# Patient Record
Sex: Female | Born: 1970
Health system: Southern US, Community
[De-identification: ages and names within clinical notes are randomized; demographics above are authoritative.]

---

## 2007-09-03 ENCOUNTER — Ambulatory Visit: Payer: Self-pay | Admitting: Family Medicine

## 2007-09-20 ENCOUNTER — Ambulatory Visit: Payer: Self-pay | Admitting: Surgery

## 2007-09-23 ENCOUNTER — Emergency Department: Payer: Self-pay | Admitting: Emergency Medicine

## 2007-09-23 HISTORY — PX: BREAST BIOPSY: SHX20

## 2007-12-12 ENCOUNTER — Ambulatory Visit: Payer: Self-pay | Admitting: Specialist

## 2007-12-19 ENCOUNTER — Ambulatory Visit: Payer: Self-pay | Admitting: Specialist

## 2008-03-07 HISTORY — PX: CARPAL TUNNEL RELEASE: SHX101

## 2009-09-09 ENCOUNTER — Ambulatory Visit: Payer: Self-pay | Admitting: Family Medicine

## 2009-09-15 ENCOUNTER — Ambulatory Visit: Payer: Self-pay | Admitting: Family Medicine

## 2012-11-28 LAB — HM PAP SMEAR: HM Pap smear: NEGATIVE

## 2012-11-28 LAB — FECAL OCCULT BLOOD, GUAIAC: Fecal Occult Blood: NEGATIVE

## 2012-12-10 ENCOUNTER — Ambulatory Visit: Payer: Self-pay | Admitting: Family Medicine

## 2012-12-10 LAB — HM MAMMOGRAPHY

## 2014-02-07 LAB — BASIC METABOLIC PANEL
BUN: 16 mg/dL (ref 4–21)
Creatinine: 0.8 mg/dL (ref <=1.1)
Glucose: 104 mg/dL
Potassium: 4.3 mmol/L (ref 3.4–5.3)
Sodium: 139 mmol/L (ref 137–147)

## 2014-02-07 LAB — LIPID PANEL
Cholesterol: 196 mg/dL (ref 0–200)
HDL: 53 mg/dL (ref 35–70)
LDL Cholesterol: 124 mg/dL
Triglycerides: 95 mg/dL (ref 40–160)

## 2014-02-07 LAB — CBC AND DIFFERENTIAL
HCT: 41 % (ref 36–46)
Hemoglobin: 14.1 g/dL (ref 12.0–16.0)
Platelets: 234 10*3/uL (ref 150–399)
WBC: 5.4 10^3/mL

## 2014-02-07 LAB — HEPATIC FUNCTION PANEL
ALT: 25 U/L (ref 7–35)
AST: 14 U/L (ref 13–35)

## 2014-02-07 LAB — HEMOGLOBIN A1C: Hgb A1c MFr Bld: 5.8 % (ref 4.0–6.0)

## 2014-02-07 LAB — TSH: TSH: 1.84 u[IU]/mL (ref <=5.90)

## 2014-06-26 DIAGNOSIS — R202 Paresthesia of skin: Secondary | ICD-10-CM | POA: Insufficient documentation

## 2014-06-26 DIAGNOSIS — G609 Hereditary and idiopathic neuropathy, unspecified: Secondary | ICD-10-CM | POA: Insufficient documentation

## 2014-06-26 DIAGNOSIS — R7303 Prediabetes: Secondary | ICD-10-CM | POA: Insufficient documentation

## 2014-06-26 DIAGNOSIS — Z6841 Body Mass Index (BMI) 40.0 and over, adult: Secondary | ICD-10-CM | POA: Insufficient documentation

## 2014-07-01 ENCOUNTER — Other Ambulatory Visit: Payer: Self-pay | Admitting: Family Medicine

## 2014-07-01 DIAGNOSIS — Z1231 Encounter for screening mammogram for malignant neoplasm of breast: Secondary | ICD-10-CM

## 2014-07-09 ENCOUNTER — Ambulatory Visit
Admission: RE | Admit: 2014-07-09 | Discharge: 2014-07-09 | Disposition: A | Discharge status: Home / Self Care | Payer: BLUE CROSS/BLUE SHIELD | Source: Ambulatory Visit | Attending: Family Medicine | Admitting: Family Medicine

## 2014-07-09 DIAGNOSIS — Z1231 Encounter for screening mammogram for malignant neoplasm of breast: Secondary | ICD-10-CM | POA: Diagnosis not present

## 2014-08-13 ENCOUNTER — Ambulatory Visit (INDEPENDENT_AMBULATORY_CARE_PROVIDER_SITE_OTHER): Payer: BLUE CROSS/BLUE SHIELD | Admitting: Family Medicine

## 2014-08-13 ENCOUNTER — Encounter: Payer: Self-pay | Admitting: Family Medicine

## 2014-08-13 VITALS — BP 126/78 | HR 96 | Temp 98.1°F | Resp 16 | Ht 62.0 in | Wt 247.0 lb

## 2014-08-13 DIAGNOSIS — R7309 Other abnormal glucose: Secondary | ICD-10-CM

## 2014-08-13 DIAGNOSIS — E559 Vitamin D deficiency, unspecified: Secondary | ICD-10-CM | POA: Diagnosis not present

## 2014-08-13 DIAGNOSIS — R7303 Prediabetes: Secondary | ICD-10-CM

## 2014-08-13 LAB — POCT GLYCOSYLATED HEMOGLOBIN (HGB A1C): Hemoglobin A1C: 5.5

## 2014-08-13 NOTE — Progress Notes (Signed)
Subjective:     Patient ID: Heather Hester, female   DOB: [birth year], 44 y.o.   MRN: [id number]  Hyperglycemia This is a chronic problem. The problem occurs constantly. Associated symptoms include arthralgias (Occasionally). Pertinent negatives include no chest pain, chills, coughing, diaphoresis, fatigue, fever, headaches, joint swelling, myalgias, nausea, neck pain, numbness or weakness.  Pt's last A1C was on 02/07/2014 and the result was 5.8%.  She does not check her sugars.  She also reports not having any hypoglycemic episodes.    Vitamin D  Pt last Vitamin D check was 16.1 on 02/07/2014, she advised to start Vitamin D 2,000 units a day.  She reports that she has not been taking it regularly.       Review of Systems  Constitutional: Negative for fever, chills, diaphoresis, activity change, appetite change, fatigue and unexpected weight change.  Respiratory: Negative for apnea, cough, choking, chest tightness, shortness of breath, wheezing and stridor.   Cardiovascular: Positive for leg swelling (Ankles swell occasionally). Negative for chest pain and palpitations.  Gastrointestinal: Negative.  Negative for nausea.  Endocrine: Negative for cold intolerance, heat intolerance, polydipsia, polyphagia and polyuria.  Musculoskeletal: Positive for arthralgias (Occasionally). Negative for myalgias, back pain, joint swelling, gait problem, neck pain and neck stiffness.  Neurological: Negative for dizziness, tremors, weakness, light-headedness, numbness and headaches.    Patient Active Problem List   Diagnosis Date Noted  . Vitamin D deficiency 08/13/2014  . Body mass index of 60 or higher 06/26/2014  . Idiopathic peripheral neuropathy 06/26/2014  . Burning or prickling sensation 06/26/2014  . Borderline diabetes 06/26/2014   Family History  Problem Relation Age of Onset  . COPD Mother   . Depression Mother   . Kidney disease Father   . Hypertension Brother   . Sleep apnea  Brother    History   Social History  . Marital Status: Single    Spouse Name: N/A  . Number of Children: 0  . Years of Education: HS   Occupational History  . [employer]     Full-Time   Social History Main Topics  . Smoking status: Never Smoker   . Smokeless tobacco: Never Used  . Alcohol Use: No  . Drug Use: No  . Sexual Activity: Not on file   Other Topics Concern  . Not on file   Social History Narrative   Past Surgical History  Procedure Laterality Date  . Carpal tunnel release Left 2010  . Breast biopsy Right 09/23/2007    core bx with clip   No Known Allergies Current Outpatient Prescriptions on File Prior to Visit  Medication Sig Dispense Refill  . Calcium-Magnesium-Vitamin D (CALCIUM 500 PO) Take 2 tablets by mouth daily.    Marland Kitchen. MILK THISTLE PO Take 3 tablets by mouth daily.    . MULTIPLE VITAMIN PO Take 1 tablet by mouth daily.     No current facility-administered medications on file prior to visit.   BP 126/78 mmHg  Pulse 96  Temp(Src) 98.1 F (36.7 C) (Oral)  Resp 16  Ht 5\' 2"  (1.575 m)  Wt 247 lb (112.038 kg)  BMI 45.17 kg/m2  LMP 07/21/2014 (Approximate)      Objective:   Physical Exam  Constitutional: She is oriented to person, place, and time. She appears well-developed and well-nourished.  Neurological: She is alert and oriented to person, place, and time.       Assessment:     See below.  Plan:     1. Borderline diabetes Improved. Continue to advance diet.   - POCT HgB A1C  2. Vitamin D deficiency Restart Vit D at 2000 iu daily.      Goals    . diet     Going to due formal diet.     . Exercise 150 minutes per week (moderate activity)

## 2014-08-14 MED ORDER — VITAMIN D 50 MCG (2000 UT) PO CAPS: 1.0000 | ORAL_CAPSULE | Freq: Every day | ORAL | Status: DC

## 2015-02-25 ENCOUNTER — Emergency Department: Payer: BLUE CROSS/BLUE SHIELD

## 2015-02-25 ENCOUNTER — Emergency Department
Admission: EM | Admit: 2015-02-25 | Discharge: 2015-02-25 | Disposition: A | Discharge status: Home / Self Care | Payer: BLUE CROSS/BLUE SHIELD | Attending: Emergency Medicine | Admitting: Emergency Medicine

## 2015-02-25 DIAGNOSIS — R079 Chest pain, unspecified: Secondary | ICD-10-CM | POA: Diagnosis present

## 2015-02-25 DIAGNOSIS — M546 Pain in thoracic spine: Secondary | ICD-10-CM | POA: Insufficient documentation

## 2015-02-25 DIAGNOSIS — M542 Cervicalgia: Secondary | ICD-10-CM | POA: Insufficient documentation

## 2015-02-25 LAB — COMPREHENSIVE METABOLIC PANEL
ALT: 20 U/L (ref 14–54)
AST: 17 U/L (ref 15–41)
Albumin: 4.1 g/dL (ref 3.5–5.0)
Alkaline Phosphatase: 67 U/L (ref 38–126)
Anion gap: 6 (ref 5–15)
BUN: 13 mg/dL (ref 6–20)
CO2: 26 mmol/L (ref 22–32)
Calcium: 8.9 mg/dL (ref 8.9–10.3)
Chloride: 107 mmol/L (ref 101–111)
Creatinine, Ser: 0.79 mg/dL (ref 0.44–1.00)
GFR calc Af Amer: >60 mL/min (ref >60)
GFR calc non Af Amer: >60 mL/min (ref >60)
Glucose, Bld: 99 mg/dL (ref 65–99)
Potassium: 3.9 mmol/L (ref 3.5–5.1)
Sodium: 139 mmol/L (ref 135–145)
Total Bilirubin: 0.5 mg/dL (ref 0.3–1.2)
Total Protein: 7.4 g/dL (ref 6.5–8.1)

## 2015-02-25 LAB — CBC
HCT: 42.3 % (ref 35.0–47.0)
Hemoglobin: 13.5 g/dL (ref 12.0–16.0)
MCH: 27.2 pg (ref 26.0–34.0)
MCHC: 32 g/dL (ref 32.0–36.0)
MCV: 84.9 fL (ref 80.0–100.0)
Platelets: 211 10*3/uL (ref 150–440)
RBC: 4.98 MIL/uL (ref 3.80–5.20)
RDW: 14.5 % (ref 11.5–14.5)
WBC: 6.6 10*3/uL (ref 3.6–11.0)

## 2015-02-25 LAB — TROPONIN I: Troponin I: <0.03 ng/mL (ref <0.031)

## 2015-02-25 MED ORDER — RANITIDINE HCL 150 MG PO TABS: 150.0000 mg | ORAL_TABLET | Freq: Every day | ORAL | Status: DC

## 2015-02-25 MED ORDER — GI COCKTAIL ~~LOC~~
30.0000 mL | Freq: Once | ORAL | Status: AC
  Administered 2015-02-25: 30 mL via ORAL
  Filled 2015-02-25: qty 30

## 2015-02-25 NOTE — Discharge Instructions (Signed)
Nonspecific Chest Pain  °Chest pain can be caused by many different conditions. There is always a chance that your pain could be related to something serious, such as a heart attack or a blood clot in your lungs. Chest pain can also be caused by conditions that are not life-threatening. If you have chest pain, it is very important to follow up with your health care provider. °CAUSES  °Chest pain can be caused by: °· Heartburn. °· Pneumonia or bronchitis. °· Anxiety or stress. °· Inflammation around your heart (pericarditis) or lung (pleuritis or pleurisy). °· A blood clot in your lung. °· A collapsed lung (pneumothorax). It can develop suddenly on its own (spontaneous pneumothorax) or from trauma to the chest. °· Shingles infection (varicella-zoster virus). °· Heart attack. °· Damage to the bones, muscles, and cartilage that make up your chest wall. This can include: °· Bruised bones due to injury. °· Strained muscles or cartilage due to frequent or repeated coughing or overwork. °· Fracture to one or more ribs. °· Sore cartilage due to inflammation (costochondritis). °RISK FACTORS  °Risk factors for chest pain may include: °· Activities that increase your risk for trauma or injury to your chest. °· Respiratory infections or conditions that cause frequent coughing. °· Medical conditions or overeating that can cause heartburn. °· Heart disease or family history of heart disease. °· Conditions or health behaviors that increase your risk of developing a blood clot. °· Having had chicken pox (varicella zoster). °SIGNS AND SYMPTOMS °Chest pain can feel like: °· Burning or tingling on the surface of your chest or deep in your chest. °· Crushing, pressure, aching, or squeezing pain. °· Dull or sharp pain that is worse when you move, cough, or take a deep breath. °· Pain that is also felt in your back, neck, shoulder, or arm, or pain that spreads to any of these areas. °Your chest pain may come and go, or it may stay  constant. °DIAGNOSIS °Lab tests or other studies may be needed to find the cause of your pain. Your health care provider may have you take a test called an ambulatory ECG (electrocardiogram). An ECG records your heartbeat patterns at the time the test is performed. You may also have other tests, such as: °· Transthoracic echocardiogram (TTE). During echocardiography, sound waves are used to create a picture of all of the heart structures and to look at how blood flows through your heart. °· Transesophageal echocardiogram (TEE). This is a more advanced imaging test that obtains images from inside your body. It allows your health care provider to see your heart in finer detail. °· Cardiac monitoring. This allows your health care provider to monitor your heart rate and rhythm in real time. °· Holter monitor. This is a portable device that records your heartbeat and can help to diagnose abnormal heartbeats. It allows your health care provider to track your heart activity for several days, if needed. °· Stress tests. These can be done through exercise or by taking medicine that makes your heart beat more quickly. °· Blood tests. °· Imaging tests. °TREATMENT  °Your treatment depends on what is causing your chest pain. Treatment may include: °· Medicines. These may include: °· Acid blockers for heartburn. °· Anti-inflammatory medicine. °· Pain medicine for inflammatory conditions. °· Antibiotic medicine, if an infection is present. °· Medicines to dissolve blood clots. °· Medicines to treat coronary artery disease. °· Supportive care for conditions that do not require medicines. This may include: °· Resting. °· Applying heat   or cold packs to injured areas. °· Limiting activities until pain decreases. °HOME CARE INSTRUCTIONS °· If you were prescribed an antibiotic medicine, finish it all even if you start to feel better. °· Avoid any activities that bring on chest pain. °· Do not use any tobacco products, including  cigarettes, chewing tobacco, or electronic cigarettes. If you need help quitting, ask your health care provider. °· Do not drink alcohol. °· Take medicines only as directed by your health care provider. °· Keep all follow-up visits as directed by your health care provider. This is important. This includes any further testing if your chest pain does not go away. °· If heartburn is the cause for your chest pain, you may be told to keep your head raised (elevated) while sleeping. This reduces the chance that acid will go from your stomach into your esophagus. °· Make lifestyle changes as directed by your health care provider. These may include: °¨ Getting regular exercise. Ask your health care provider to suggest some activities that are safe for you. °¨ Eating a heart-healthy diet. A registered dietitian can help you to learn healthy eating options. °¨ Maintaining a healthy weight. °¨ Managing diabetes, if necessary. °¨ Reducing stress. °SEEK MEDICAL CARE IF: °· Your chest pain does not go away after treatment. °· You have a rash with blisters on your chest. °· You have a fever. °SEEK IMMEDIATE MEDICAL CARE IF:  °· Your chest pain is worse. °· You have an increasing cough, or you cough up blood. °· You have severe abdominal pain. °· You have severe weakness. °· You faint. °· You have chills. °· You have sudden, unexplained chest discomfort. °· You have sudden, unexplained discomfort in your arms, back, neck, or jaw. °· You have shortness of breath at any time. °· You suddenly start to sweat, or your skin gets clammy. °· You feel nauseous or you vomit. °· You suddenly feel light-headed or dizzy. °· Your heart begins to beat quickly, or it feels like it is skipping beats. °These symptoms may represent a serious problem that is an emergency. Do not wait to see if the symptoms will go away. Get medical help right away. Call your local emergency services (911 in the [place] Do not drive yourself to the hospital. °  °This  information is not intended to replace advice given to you by your health care provider. Make sure you discuss any questions you have with your health care provider. °  °Document Released: 12/01/2004 Document Revised: 03/14/2014 Document Reviewed: 09/27/2013 °Elsevier Interactive Patient Education ©2016 Elsevier [employer] ° °Gastroesophageal Reflux Disease, Adult °Normally, food travels down the esophagus and stays in the stomach to be digested. However, when a person has gastroesophageal reflux disease (GERD), food and stomach acid move back up into the esophagus. When this happens, the esophagus becomes sore and inflamed. Over time, GERD can create small holes (ulcers) in the lining of the esophagus.  °CAUSES °This condition is caused by a problem with the muscle between the esophagus and the stomach (lower esophageal sphincter, or LES). Normally, the LES muscle closes after food passes through the esophagus to the stomach. When the LES is weakened or abnormal, it does not close properly, and that allows food and stomach acid to go back up into the esophagus. The LES can be weakened by certain dietary substances, medicines, and medical conditions, including: °· Tobacco use. °· Pregnancy. °· Having a hiatal hernia. °· Heavy alcohol use. °· Certain foods and beverages, such as coffee,   chocolate, onions, and peppermint. °RISK FACTORS °This condition is more likely to develop in: °· People who have an increased body weight. °· People who have connective tissue disorders. °· People who use NSAID medicines. °SYMPTOMS °Symptoms of this condition include: °· Heartburn. °· Difficult or painful swallowing. °· The feeling of having a lump in the throat. °· A bitter taste in the mouth. °· Bad breath. °· Having a large amount of saliva. °· Having an upset or bloated stomach. °· Belching. °· Chest pain. °· Shortness of breath or wheezing. °· Ongoing (chronic) cough or a night-time cough. °· Wearing away of tooth enamel. °· Weight  loss. °Different conditions can cause chest pain. Make sure to see your health care provider if you experience chest pain. °DIAGNOSIS °Your health care provider will take a medical history and perform a physical exam. To determine if you have mild or severe GERD, your health care provider may also monitor how you respond to treatment. You may also have other tests, including: °· An endoscopy to examine your stomach and esophagus with a small camera. °· A test that measures the acidity level in your esophagus. °· A test that measures how much pressure is on your esophagus. °· A barium swallow or modified barium swallow to show the shape, size, and functioning of your esophagus. °TREATMENT °The goal of treatment is to help relieve your symptoms and to prevent complications. Treatment for this condition may vary depending on how severe your symptoms are. Your health care provider may recommend: °· Changes to your diet. °· Medicine. °· Surgery. °HOME CARE INSTRUCTIONS °Diet °· Follow a diet as recommended by your health care provider. This may involve avoiding foods and drinks such as: °¨ Coffee and tea (with or without caffeine). °¨ Drinks that contain alcohol. °¨ Energy drinks and sports drinks. °¨ Carbonated drinks or sodas. °¨ Chocolate and cocoa. °¨ Peppermint and mint flavorings. °¨ Garlic and onions. °¨ Horseradish. °¨ Spicy and acidic foods, including peppers, chili powder, curry powder, vinegar, hot sauces, and barbecue sauce. °¨ Citrus fruit juices and citrus fruits, such as oranges, lemons, and limes. °¨ Tomato-based foods, such as red sauce, chili, salsa, and pizza with red sauce. °¨ Fried and fatty foods, such as donuts, french fries, potato chips, and high-fat dressings. °¨ High-fat meats, such as hot dogs and fatty cuts of red and white meats, such as rib eye steak, sausage, ham, and bacon. °¨ High-fat dairy items, such as whole milk, butter, and cream cheese. °· Eat small, frequent meals instead of large  meals. °· Avoid drinking large amounts of liquid with your meals. °· Avoid eating meals during the 2-3 hours before bedtime. °· Avoid lying down right after you eat. °· Do not exercise right after you eat. ° General Instructions  °· Pay attention to any changes in your symptoms. °· Take over-the-counter and prescription medicines only as told by your health care provider. Do not take aspirin, ibuprofen, or other NSAIDs unless your health care provider told you to do so. °· Do not use any tobacco products, including cigarettes, chewing tobacco, and e-cigarettes. If you need help quitting, ask your health care provider. °· Wear loose-fitting clothing. Do not wear anything tight around your waist that causes pressure on your abdomen. °· Raise (elevate) the head of your bed 6 inches (15cm). °· Try to reduce your stress, such as with yoga or meditation. If you need help reducing stress, ask your health care provider. °· If you are overweight, reduce your weight to an amount that is   healthy for you. Ask your health care provider for guidance about a safe weight loss goal. °· Keep all follow-up visits as told by your health care provider. This is important. °SEEK MEDICAL CARE IF: °· You have new symptoms. °· You have unexplained weight loss. °· You have difficulty swallowing, or it hurts to swallow. °· You have wheezing or a persistent cough. °· Your symptoms do not improve with treatment. °· You have a hoarse voice. °SEEK IMMEDIATE MEDICAL CARE IF: °· You have pain in your arms, neck, jaw, teeth, or back. °· You feel sweaty, dizzy, or light-headed. °· You have chest pain or shortness of breath. °· You vomit and your vomit looks like blood or coffee grounds. °· You faint. °· Your stool is bloody or black. °· You cannot swallow, drink, or eat. °  °This information is not intended to replace advice given to you by your health care provider. Make sure you discuss any questions you have with your health care provider. °    °Document Released: 12/01/2004 Document Revised: 11/12/2014 Document Reviewed: 06/18/2014 °Elsevier Interactive Patient Education ©2016 Elsevier Inc. ° °

## 2015-02-25 NOTE — ED Notes (Signed)
Pt C/O chest pain since yesterday, L.sided that radiates into back with SOB. No cardiac Hx

## 2015-02-25 NOTE — ED Provider Notes (Signed)
[facility] Emergency Department Provider Note     Time seen: ----------------------------------------- 8:49 AM on 02/25/2015 -----------------------------------------    I have reviewed the triage vital signs and the nursing notes.   HISTORY  Chief Complaint No chief complaint on file.    HPI SELISA TENSLEY is a 44 y.o. female who presents ER for combination of left-sided chest pain, right-sided neck pain and right-sided upper back pain. Patient states it feels like gas pain, nothing makes it better or worse. Patient does not have history of this before, does not have any risk factors for coronary artery disease other than being obese. She denies any recent illness, does not take any medication.   No past medical history on file.  Patient Active Problem List   Diagnosis Date Noted  . Vitamin D deficiency 08/13/2014  . Body mass index of 60 or higher (HCC) 06/26/2014  . Idiopathic peripheral neuropathy (HCC) 06/26/2014  . Burning or prickling sensation 06/26/2014  . Borderline diabetes 06/26/2014    Past Surgical History  Procedure Laterality Date  . Carpal tunnel release Left 2010  . Breast biopsy Right 09/23/2007    core bx with clip    Allergies Review of patient's allergies indicates no known allergies.  Social History Social History  Substance Use Topics  . Smoking status: Never Smoker   . Smokeless tobacco: Never Used  . Alcohol Use: No    Review of Systems Constitutional: Negative for fever. Eyes: Negative for visual changes. ENT: Negative for sore throat. Cardiovascular: Positive for chest pain Respiratory: Negative for shortness of breath. Gastrointestinal: Negative for abdominal pain, vomiting and diarrhea. Genitourinary: Negative for dysuria. Musculoskeletal: Positive for back pain Skin: Negative for rash. Neurological: Negative for headaches, focal weakness or numbness.  10-point ROS otherwise  negative.  ____________________________________________   PHYSICAL EXAM:  VITAL SIGNS: ED Triage Vitals  Enc Vitals Group     BP --      Pulse --      Resp --      Temp --      Temp src --      SpO2 --      Weight --      Height --      Head Cir --      Peak Flow --      Pain Score --      Pain Loc --      Pain Edu? --      Excl. in GC? --     Constitutional: Alert and oriented. Well appearing and in no distress. Eyes: Conjunctivae are normal. PERRL. Normal extraocular movements. ENT   Head: Normocephalic and atraumatic.   Nose: No congestion/rhinnorhea.   Mouth/Throat: Mucous membranes are moist.   Neck: No stridor. Cardiovascular: Normal rate, regular rhythm. Normal and symmetric distal pulses are present in all extremities. No murmurs, rubs, or gallops. Respiratory: Normal respiratory effort without tachypnea nor retractions. Breath sounds are clear and equal bilaterally. No wheezes/rales/rhonchi. Gastrointestinal: Soft and nontender. No distention. No abdominal bruits.  Musculoskeletal: Nontender with normal range of motion in all extremities. No joint effusions.  No lower extremity tenderness nor edema. Neurologic:  Normal speech and language. No gross focal neurologic deficits are appreciated. Speech is normal. No gait instability. Skin:  Skin is warm, dry and intact. No rash noted. Psychiatric: Mood and affect are normal. Speech and behavior are normal. Patient exhibits appropriate insight and judgment. ____________________________________________  EKG: Interpreted by me. Normal sinus rhythm with a  rate of 89 bpm, normal PR interval, normal QS with, normal QT interval.  ____________________________________________  ED COURSE:  Pertinent labs & imaging results that were available during my care of the patient were reviewed by me and considered in my medical decision making (see chart for details). Patient is in no acute distress, low risk for ACS,  we'll check cardiac labs and reevaluate. ____________________________________________    LABS (pertinent positives/negatives)  Labs Reviewed  CBC  TROPONIN I  COMPREHENSIVE METABOLIC PANEL    RADIOLOGY  Chest x-ray IMPRESSION: No edema or consolidation. ____________________________________________  FINAL ASSESSMENT AND PLAN  Nonspecific chest pain  Plan: Patient with labs and imaging as dictated above. Symptoms are likely GERD related. She'll be placed on an acids. She was given GI cocktail here. She stable for outpatient follow-up with her doctor. She is low risk for ACS, heart score is low.   Emily FilbertWilliams, Maizie Garno E, MD   Emily FilbertJonathan E Monzerrat Wellen, MD 02/25/15 [phone]

## 2015-03-13 ENCOUNTER — Ambulatory Visit (INDEPENDENT_AMBULATORY_CARE_PROVIDER_SITE_OTHER): Payer: BLUE CROSS/BLUE SHIELD | Admitting: Family Medicine

## 2015-03-13 ENCOUNTER — Encounter: Payer: Self-pay | Admitting: Family Medicine

## 2015-03-13 VITALS — BP 118/80 | HR 92 | Temp 98.2°F | Resp 16 | Ht 62.0 in | Wt 260.0 lb

## 2015-03-13 DIAGNOSIS — J069 Acute upper respiratory infection, unspecified: Secondary | ICD-10-CM

## 2015-03-13 DIAGNOSIS — R7303 Prediabetes: Secondary | ICD-10-CM | POA: Diagnosis not present

## 2015-03-13 DIAGNOSIS — Z6841 Body Mass Index (BMI) 40.0 and over, adult: Secondary | ICD-10-CM

## 2015-03-13 DIAGNOSIS — Z0000 Encounter for general adult medical examination without abnormal findings: Secondary | ICD-10-CM | POA: Diagnosis not present

## 2015-03-13 DIAGNOSIS — Z1211 Encounter for screening for malignant neoplasm of colon: Secondary | ICD-10-CM

## 2015-03-13 LAB — IFOBT (OCCULT BLOOD): IFOBT: POSITIVE

## 2015-03-13 NOTE — Progress Notes (Signed)
Patient ID: Heather Hester, female   DOB: 1970/08/29, 45 y.o.   MRN: [id number]       Patient: Heather Hester, Female    DOB: 1970/08/29, 45 y.o.   MRN: [id number] Visit Date: 03/13/2015  Today's Provider: Lorie PhenixNancy Jamesa Tedrick, MD   Chief Complaint  Patient presents with  . Annual Exam  . URI  . Obesity   Subjective:    Annual physical exam Heather Hester is a 45 y.o. female who presents today for health maintenance and complete physical. She feels fairly well. She reports exercising none. She reports she is sleeping well.  02/06/14 CPE 11/28/12 Pap-neg; HPV-neg 07/09/14 Mammo-BI-RADS 1  Lab Results  Component Value Date   WBC 6.6 02/25/2015   HGB 13.5 02/25/2015   HCT 42.3 02/25/2015   PLT 211 02/25/2015   GLUCOSE 99 02/25/2015   CHOL 196 02/07/2014   TRIG 95 02/07/2014   HDL 53 02/07/2014   LDLCALC 124 02/07/2014   ALT 20 02/25/2015   AST 17 02/25/2015   NA 139 02/25/2015   K 3.9 02/25/2015   CL 107 02/25/2015   CREATININE 0.79 02/25/2015   BUN 13 02/25/2015   CO2 26 02/25/2015   TSH 1.84 02/07/2014   HGBA1C 5.5 08/13/2014    -----------------------------------------------------------------  Upper Respiratory Infection: Patient complains of symptoms of a URI. Symptoms include cough. Onset of symptoms was 3 days ago, gradually worsening since that time. She also c/o post nasal drip, productive cough with  yellow colored sputum and sinus pressure for the past 2 days .  She is drinking moderate amounts of fluids. Evaluation to date: none. Treatment to date: none.  Weight: Patient would like would like to start an OTC medication for weight loss.  Has bought some herbal  Supplements for me to review.    Wt Readings from Last 3 Encounters:  03/13/15 260 lb (117.935 kg)  02/25/15 250 lb (113.399 kg)  08/13/14 247 lb (112.038 kg)     Follow up ER visit  Patient was seen in ER for chest pain on 02/25/2015. She was treated for heart burn. Treatment for this  included GI cocktail at the ER and Zantac to take at home. She reports this condition is Improved. Patient reports that she never got the Zantac filled. Patient reports she has not had symptoms again.  ------------------------------------------------------------------------------------   Review of Systems  Constitutional: Negative.   HENT: Positive for postnasal drip, rhinorrhea and sneezing.   Eyes: Negative.   Respiratory: Positive for cough.   Cardiovascular: Negative.   Gastrointestinal: Negative.   Endocrine: Negative.   Genitourinary: Negative.   Musculoskeletal: Negative.   Skin: Negative.   Allergic/Immunologic: Negative.   Neurological: Negative.   Hematological: Negative.   Psychiatric/Behavioral: Negative.     Social History      She  reports that she has never smoked. She has never used smokeless tobacco. She reports that she does not drink alcohol or use illicit drugs.       Social History   Social History  . Marital Status: Single    Spouse Name: N/A  . Number of Children: 0  . Years of Education: HS   Occupational History  . [employer]     Full-Time   Social History Main Topics  . Smoking status: Never Smoker   . Smokeless tobacco: Never Used  . Alcohol Use: No  . Drug Use: No  . Sexual Activity: Not Asked   Other Topics Concern  .  None   Social History Narrative    History reviewed. No pertinent past medical history.   Patient Active Problem List   Diagnosis Date Noted  . Vitamin D deficiency 08/13/2014  . Body mass index of 60 or higher (HCC) 06/26/2014  . Idiopathic peripheral neuropathy (HCC) 06/26/2014  . Burning or prickling sensation 06/26/2014  . Borderline diabetes 06/26/2014    Past Surgical History  Procedure Laterality Date  . Carpal tunnel release Left 2010  . Breast biopsy Right 09/23/2007    core bx with clip    Family History        Family Status  Relation Status Death Age  . Mother Alive   .  Father Deceased   . Brother Alive         Her family history includes COPD in her mother; Depression in her mother; Hypertension in her brother; Kidney disease in her father; Sleep apnea in her brother.    No Known Allergies  Previous Medications   CHOLECALCIFEROL (VITAMIN D) 2000 UNITS CAPS    Take 1 capsule (2,000 Units total) by mouth daily.    Patient Care Team: Lorie Phenix, MD as PCP - General (Family Medicine)     Objective:   Vitals: BP 118/80 mmHg  Pulse 92  Temp(Src) 98.2 F (36.8 C)  Resp 16  Ht 5\' 2"  (1.575 m)  Wt 260 lb (117.935 kg)  BMI 47.54 kg/m2  SpO2 96%  LMP 02/26/2015 (Approximate)   Physical Exam  Constitutional: She is oriented to person, place, and time. She appears well-developed and well-nourished.  HENT:  Head: Normocephalic and atraumatic.  Right Ear: Tympanic membrane, external ear and ear canal normal.  Left Ear: Tympanic membrane, external ear and ear canal normal.  Nose: Mucosal edema and rhinorrhea present.  Mouth/Throat: Uvula is midline, oropharynx is clear and moist and mucous membranes are normal.  Eyes: Conjunctivae, EOM and lids are normal. Pupils are equal, round, and reactive to light.  Neck: Trachea normal and normal range of motion. Neck supple. Carotid bruit is not present. No thyroid mass and no thyromegaly present.  Cardiovascular: Normal rate, regular rhythm and normal heart sounds.   Pulmonary/Chest: Effort normal and breath sounds normal.  Abdominal: Soft. Normal appearance and bowel sounds are normal. There is no hepatosplenomegaly. There is no tenderness.  Genitourinary: No breast swelling, tenderness or discharge.  Musculoskeletal: Normal range of motion.  Lymphadenopathy:    She has no cervical adenopathy.    She has no axillary adenopathy.  Neurological: She is alert and oriented to person, place, and time. She has normal strength. No cranial nerve deficit.  Skin: Skin is warm, dry and intact.  Psychiatric: She has  a normal mood and affect. Her speech is normal and behavior is normal. Judgment and thought content normal. Cognition and memory are normal.     Depression Screen PHQ 2/9 Scores 03/13/2015  PHQ - 2 Score 0      Assessment & Plan:     Routine Health Maintenance and Physical Exam  Exercise Activities and Dietary recommendations Goals    . diet     Going to due formal diet.     . Exercise 150 minutes per week (moderate activity)    . Exercise 150 minutes per week (moderate activity)       Immunization History  Administered Date(s) Administered  . Tdap 11/28/2012        1. Annual physical exam Stable. Patient advised to continue eating healthy and exercise daily. -  Lipid Panel With LDL/HDL Ratio - TSH - Comprehensive metabolic panel  2. Borderline diabetes - Hemoglobin A1c  3. Colon cancer screening Positive in office today.  Does have some rectal irritation. Will repeat when irritation settles down and return iFOBT to office.  Stressed importance of this.  - IFOBT POC (occult bld, rslt in office) Results for orders placed or performed in visit on 03/13/15  IFOBT POC (occult bld, rslt in office)  Result Value Ref Range   IFOBT Positive      4. Body mass index of 49 (HCC) Stressed importance of eating healthy, whole foods versus processed food and recheck in 3 to 6 months. Will check labs.  Did look into herbal supplements. Did not have any good trials showing weight loss. One showed liver issues. Will check labs today and call for lab recheck in 4 weeks if decides to try supplements but not advisable.  - TSH - Comprehensive metabolic panel  5. Upper respiratory infection New problem. Patient advised to call if symptoms are worsening or not improving.     Patient seen and examined by Dr. Leo Grosser, and note scribed by Liz Beach. Dimas, CMA.  I have reviewed the document for accuracy and completeness and I agree with above. Leo Grosser, MD    Lorie Phenix, MD    --------------------------------------------------------------------

## 2015-04-03 ENCOUNTER — Encounter: Payer: BLUE CROSS/BLUE SHIELD | Admitting: Family Medicine

## 2015-04-04 LAB — COMPREHENSIVE METABOLIC PANEL
ALT: 19 IU/L (ref 0–32)
AST: 12 IU/L (ref 0–40)
Albumin/Globulin Ratio: 1.6 (ref 1.1–2.5)
Albumin: 4.4 g/dL (ref 3.5–5.5)
Alkaline Phosphatase: 75 IU/L (ref 39–117)
BUN/Creatinine Ratio: 18 (ref 9–23)
BUN: 14 mg/dL (ref 6–24)
Bilirubin Total: 0.3 mg/dL (ref 0.0–1.2)
CO2: 22 mmol/L (ref 18–29)
Calcium: 9.4 mg/dL (ref 8.7–10.2)
Chloride: 100 mmol/L (ref 96–106)
Creatinine, Ser: 0.8 mg/dL (ref 0.57–1.00)
GFR calc Af Amer: 103 mL/min/{1.73_m2} (ref >59)
GFR calc non Af Amer: 89 mL/min/{1.73_m2} (ref >59)
Globulin, Total: 2.7 g/dL (ref 1.5–4.5)
Glucose: 108 mg/dL — ABNORMAL HIGH (ref 65–99)
Potassium: 4.1 mmol/L (ref 3.5–5.2)
Sodium: 142 mmol/L (ref 134–144)
Total Protein: 7.1 g/dL (ref 6.0–8.5)

## 2015-04-04 LAB — LIPID PANEL WITH LDL/HDL RATIO
Cholesterol, Total: 202 mg/dL — ABNORMAL HIGH (ref 100–199)
HDL: 53 mg/dL (ref >39)
LDL Calculated: 122 mg/dL — ABNORMAL HIGH (ref 0–99)
LDl/HDL Ratio: 2.3 ratio units (ref 0.0–3.2)
Triglycerides: 136 mg/dL (ref 0–149)
VLDL Cholesterol Cal: 27 mg/dL (ref 5–40)

## 2015-04-04 LAB — TSH: TSH: 1.71 u[IU]/mL (ref 0.450–4.500)

## 2015-04-04 LAB — HEMOGLOBIN A1C
Est. average glucose Bld gHb Est-mCnc: 120 mg/dL
Hgb A1c MFr Bld: 5.8 % — ABNORMAL HIGH (ref 4.8–5.6)

## 2015-04-06 ENCOUNTER — Telehealth: Payer: Self-pay

## 2015-04-06 NOTE — Telephone Encounter (Signed)
Advised pt of lab results. Pt verbally acknowledges understanding. Asusena Sigley Drozdowski, CMA   

## 2015-04-06 NOTE — Telephone Encounter (Signed)
LMTCB. sd  

## 2015-04-06 NOTE — Telephone Encounter (Signed)
-----   Message from Lorie Phenix, MD sent at 04/04/2015  9:26 AM EST ----- Labs stable. Blood sugar and cholesterol at upper limits of normal.  PLease notify patient. Thanks.

## 2015-04-16 ENCOUNTER — Telehealth: Payer: Self-pay

## 2015-04-16 ENCOUNTER — Telehealth (INDEPENDENT_AMBULATORY_CARE_PROVIDER_SITE_OTHER): Payer: BLUE CROSS/BLUE SHIELD

## 2015-04-16 DIAGNOSIS — R195 Other fecal abnormalities: Secondary | ICD-10-CM

## 2015-04-16 LAB — IFOBT (OCCULT BLOOD): IFOBT: NEGATIVE

## 2015-04-16 NOTE — Telephone Encounter (Signed)
Pt dropped of OC Light.  It was negative for blood.   Thanks,   -Vernona Rieger

## 2015-04-16 NOTE — Telephone Encounter (Signed)
-----   Message from Lorie Phenix, MD sent at 04/16/2015  2:36 PM EST ----- Negative. Please notify patient. Thanks

## 2015-04-16 NOTE — Telephone Encounter (Signed)
Great.  Please notify patient. Thanks.

## 2015-04-16 NOTE — Telephone Encounter (Signed)
LMTCB Jasmain Ahlberg Drozdowski, CMA  

## 2015-09-01 ENCOUNTER — Other Ambulatory Visit: Payer: Self-pay | Admitting: Family Medicine

## 2015-09-01 DIAGNOSIS — Z1231 Encounter for screening mammogram for malignant neoplasm of breast: Secondary | ICD-10-CM

## 2015-09-09 ENCOUNTER — Ambulatory Visit (INDEPENDENT_AMBULATORY_CARE_PROVIDER_SITE_OTHER): Payer: BLUE CROSS/BLUE SHIELD | Admitting: Physician Assistant

## 2015-09-09 ENCOUNTER — Encounter: Payer: Self-pay | Admitting: Physician Assistant

## 2015-09-09 VITALS — BP 138/86 | HR 82 | Temp 98.1°F | Resp 16 | Wt 256.2 lb

## 2015-09-09 DIAGNOSIS — R7303 Prediabetes: Secondary | ICD-10-CM

## 2015-09-09 DIAGNOSIS — R253 Fasciculation: Secondary | ICD-10-CM | POA: Diagnosis not present

## 2015-09-09 DIAGNOSIS — E559 Vitamin D deficiency, unspecified: Secondary | ICD-10-CM | POA: Diagnosis not present

## 2015-09-09 DIAGNOSIS — M2550 Pain in unspecified joint: Secondary | ICD-10-CM

## 2015-09-09 NOTE — Patient Instructions (Signed)
Joint Pain Joint pain, which is also called arthralgia, can be caused by many things. Joint pain often goes away when you follow your health care provider's instructions for relieving pain at home. However, joint pain can also be caused by conditions that require further treatment. Common causes of joint pain include:  Bruising in the area of the joint.  Overuse of the joint.  Wear and tear on the joints that occur with aging (osteoarthritis).  Various other forms of arthritis.  A buildup of a crystal form of uric acid in the joint (gout).  Infections of the joint (septic arthritis) or of the bone (osteomyelitis). Your health care provider may recommend medicine to help with the pain. If your joint pain continues, additional tests may be needed to diagnose your condition. HOME CARE INSTRUCTIONS Watch your condition for any changes. Follow these instructions as directed to lessen the pain that you are feeling.  Take medicines only as directed by your health care provider.  Rest the affected area for as long as your health care provider says that you should. If directed to do so, raise the painful joint above the level of your heart while you are sitting or lying down.  Do not do things that cause or worsen pain.  If directed, apply ice to the painful area:  Put ice in a plastic bag.  Place a towel between your skin and the bag.  Leave the ice on for 20 minutes, 2-3 times per day.  Wear an elastic bandage, splint, or sling as directed by your health care provider. Loosen the elastic bandage or splint if your fingers or toes become numb and tingle, or if they turn cold and blue.  Begin exercising or stretching the affected area as directed by your health care provider. Ask your health care provider what types of exercise are safe for you.  Keep all follow-up visits as directed by your health care provider. This is important. SEEK MEDICAL CARE IF:  Your pain increases, and medicine  does not help.  Your joint pain does not improve within 3 days.  You have increased bruising or swelling.  You have a fever.  You lose 10 lb (4.5 kg) or more without trying. SEEK IMMEDIATE MEDICAL CARE IF:  You are not able to move the joint.  Your fingers or toes become numb or they turn cold and blue.   This information is not intended to replace advice given to you by your health care provider. Make sure you discuss any questions you have with your health care provider.   Document Released: 02/21/2005 Document Revised: 03/14/2014 Document Reviewed: 12/03/2013 Elsevier Interactive Patient Education 2016 Elsevier Inc.  Myoclonus Myoclonus is a term that refers to brief, involuntary twitching or jerking of a muscle or a group of muscles. It describes a symptom, and generally, is not a diagnosis of a disease. The myoclonic twitches or jerks are usually caused by sudden muscle contractions. They also can result from brief lapses of contraction. Myoclonic twitches or jerks may occur:  Alone or in sequence.  In a pattern or without pattern.  Infrequently or many times each minute. Often times, myoclonus is one of several symptoms in a wide variety of nervous system disorders such as:  Multiple sclerosis.  Parkinson's disease.  Alzheimer's disease.  Creutzfeldt-Jakob disease. Familiar examples of normal myoclonus include:  Hiccups and jerks.  "Sleep starts" that some people have while drifting off to sleep. Severe cases can severely limit a person's ability to:  Eat.  Talk.  Walk. Myoclonic jerks commonly occur in individuals with epilepsy. The most common types of myoclonus include:  Action.  Cortical reflex.  Essential.  Palatal.  Progressive myoclonus epilepsy.  Reticular reflex.  Sleep.  Stimulus-sensitive. TREATMENT  Treatment for myoclonus consists of medicines that may help reduce symptoms. These drugs (many of which are also used to treat  epilepsy) include:   Barbiturates.  Clonazepam.  Phenytoin.  Primidone.  Sodium valproate. The complex origins of myoclonus may require the use of multiple drugs.   This information is not intended to replace advice given to you by your health care provider. Make sure you discuss any questions you have with your health care provider.   Document Released: 02/11/2002 Document Revised: 05/16/2011 Document Reviewed: 01/24/2013 Elsevier Interactive Patient Education [employer]2016 Elsevier Inc.

## 2015-09-09 NOTE — Progress Notes (Signed)
Patient: Heather Hester Female    DOB: 1970/09/24   45 y.o.   MRN: [id number] Visit Date: 09/09/2015  Today's Provider: Mar Daring, PA-C   Chief Complaint  Patient presents with  . Knee Pain   Subjective:    Knee Pain  The incident occurred more than 1 week ago (a couple of weeks ago). There was no injury mechanism. The pain is present in the left knee and right knee. Pertinent negatives include no inability to bear weight, loss of sensation, muscle weakness, numbness or tingling. Associated symptoms comments: She was having tightness but reports that she is doing better now.. She reports no foreign bodies present. Exacerbated by: It got aggravated by standing. She has tried nothing for the symptoms.   Patient is concern about having twitching on her body. She reports having twitching on her right fingers. This been happening for a few weeks. 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   Currently her mother is stable enough to be able to stay home during the day while she works. She does report some stress and anxiety with this, but it is more from her wanting to make sure she is there for her mother than it is the stress of caring for her mother.   She has been reading up and wants to make sure it is not Parkinson's or MS.      No Known Allergies Current Meds  Medication Sig  . Cholecalciferol (VITAMIN D) 2000 UNITS CAPS Take 1 capsule (2,000 Units total) by mouth daily. (Patient taking differently: Take 1 capsule by mouth at bedtime. )    Review of Systems  Constitutional: Negative for activity change and fatigue.  HENT: Negative.   Respiratory: Negative for cough, chest tightness and shortness of breath.   Cardiovascular: Negative for chest pain, palpitations and  leg swelling.  Musculoskeletal: Positive for myalgias and arthralgias. Negative for gait problem.  Neurological: Negative for dizziness, tingling, weakness, numbness and headaches.       Twitching of face and fingers  Psychiatric/Behavioral: Negative for sleep disturbance and dysphoric mood. The patient is nervous/anxious.     Social History  Substance Use Topics  . Smoking status: Never Smoker   . Smokeless tobacco: Never Used  . Alcohol Use: No   Objective:   BP 138/86 mmHg  Pulse 82  Temp(Src) 98.1 F (36.7 C) (Oral)  Resp 16  Wt 256 lb 3.2 oz (116.212 kg)  LMP 08/23/2015  Physical Exam  Constitutional: She is oriented to person, place, and time. She appears well-developed and well-nourished. No distress.  Neck: Normal range of motion. Neck supple.  Cardiovascular: Normal rate, regular rhythm and normal heart sounds.  Exam reveals no gallop and no friction rub.   No murmur heard. Pulmonary/Chest: Effort normal and breath sounds normal. No respiratory distress. She has no wheezes. She has no rales.  Neurological: She is alert and oriented to person, place, and time. No cranial nerve deficit. Coordination normal.  Skin: She is not diaphoretic.  Psychiatric: She has a normal mood and affect. Her behavior is normal. Judgment and thought content normal.  Vitals reviewed.     Assessment & Plan:     1. Arthralgia I will  check labs as below to r/o anemia, vit deficiency, or autoimmune cause for twitching and joint pains (knees and fingers). If all labs are normal will most likely refer to neurology for further evaluation and to r/o MS and parkinson's disease for her. She is to call if symptoms worsen in the meantime. I will f/u pending results. - CBC with Differential - Comprehensive Metabolic Panel (CMET) - Rheumatoid Factor - ANA w/Reflex if Positive - Sed Rate (ESR)  2. Twitching See above medical treatment plan. - B12 - Folate - Rheumatoid Factor - ANA w/Reflex if  Positive - Sed Rate (ESR)  3. Borderline diabetes Will check glucose and f/u pending results.  4. Vitamin D deficiency Takes 2000 IU Vit D3 daily. Want to recheck to make sure this is enough and that this is not causing symptoms. Will f/u pending lab results. - CBC with Differential - Vitamin D (25 hydroxy)       Mar Daring, PA-C  [facility]

## 2015-09-10 ENCOUNTER — Telehealth: Payer: Self-pay

## 2015-09-10 LAB — COMPREHENSIVE METABOLIC PANEL
ALT: 19 IU/L (ref 0–32)
AST: 13 IU/L (ref 0–40)
Albumin/Globulin Ratio: 1.6 (ref 1.2–2.2)
Albumin: 4.4 g/dL (ref 3.5–5.5)
Alkaline Phosphatase: 89 IU/L (ref 39–117)
BUN/Creatinine Ratio: 16 (ref 9–23)
BUN: 14 mg/dL (ref 6–24)
Bilirubin Total: 0.3 mg/dL (ref 0.0–1.2)
CO2: 23 mmol/L (ref 18–29)
Calcium: 9.3 mg/dL (ref 8.7–10.2)
Chloride: 99 mmol/L (ref 96–106)
Creatinine, Ser: 0.88 mg/dL (ref 0.57–1.00)
GFR calc Af Amer: 91 mL/min/{1.73_m2} (ref >59)
GFR calc non Af Amer: 79 mL/min/{1.73_m2} (ref >59)
Globulin, Total: 2.8 g/dL (ref 1.5–4.5)
Glucose: 82 mg/dL (ref 65–99)
Potassium: 4 mmol/L (ref 3.5–5.2)
Sodium: 139 mmol/L (ref 134–144)
Total Protein: 7.2 g/dL (ref 6.0–8.5)

## 2015-09-10 LAB — CBC WITH DIFFERENTIAL/PLATELET
Basophils Absolute: 0 10*3/uL (ref 0.0–0.2)
Basos: 0 %
EOS (ABSOLUTE): 0.1 10*3/uL (ref 0.0–0.4)
Eos: 1 %
Hematocrit: 42.6 % (ref 34.0–46.6)
Hemoglobin: 14.1 g/dL (ref 11.1–15.9)
Immature Grans (Abs): 0 10*3/uL (ref 0.0–0.1)
Immature Granulocytes: 0 %
Lymphocytes Absolute: 2.2 10*3/uL (ref 0.7–3.1)
Lymphs: 19 %
MCH: 27.6 pg (ref 26.6–33.0)
MCHC: 33.1 g/dL (ref 31.5–35.7)
MCV: 84 fL (ref 79–97)
Monocytes Absolute: 0.7 10*3/uL (ref 0.1–0.9)
Monocytes: 6 %
Neutrophils Absolute: 8.4 10*3/uL — ABNORMAL HIGH (ref 1.4–7.0)
Neutrophils: 74 %
Platelets: 283 10*3/uL (ref 150–379)
RBC: 5.1 x10E6/uL (ref 3.77–5.28)
RDW: 14.6 % (ref 12.3–15.4)
WBC: 11.4 10*3/uL — ABNORMAL HIGH (ref 3.4–10.8)

## 2015-09-10 LAB — ANA W/REFLEX IF POSITIVE: Anti Nuclear Antibody(ANA): NEGATIVE

## 2015-09-10 LAB — VITAMIN B12: Vitamin B-12: 352 pg/mL (ref 211–946)

## 2015-09-10 LAB — SEDIMENTATION RATE: Sed Rate: 29 mm/hr (ref 0–32)

## 2015-09-10 LAB — VITAMIN D 25 HYDROXY (VIT D DEFICIENCY, FRACTURES): Vit D, 25-Hydroxy: 25.9 ng/mL — ABNORMAL LOW (ref 30.0–100.0)

## 2015-09-10 LAB — FOLATE: Folate: 7.2 ng/mL (ref >3.0)

## 2015-09-10 LAB — RHEUMATOID FACTOR: Rhuematoid fact SerPl-aCnc: <10 IU/mL (ref 0.0–13.9)

## 2015-09-10 NOTE — Telephone Encounter (Signed)
-----   Message from Margaretann LovelessJennifer M Burnette, [place]PA-C sent at 09/10/2015 12:21 PM EDT ----- WBC count is up and may have some sort of infection going on. Can we see if she is having any urinary or URI symptoms? Also Vit D is low so will increase Vit D supplement and will send in. All other labs were normal. If no other symptoms will most likely recheck CBC with diff in 2 weeks.

## 2015-09-10 NOTE — Telephone Encounter (Signed)
No answer/maillbox is full.  Thanks,  -Aundraya Dripps

## 2015-09-11 MED ORDER — VITAMIN D (ERGOCALCIFEROL) 1.25 MG (50000 UNIT) PO CAPS: 50000.0000 [IU] | ORAL_CAPSULE | ORAL | Status: DC

## 2015-09-11 NOTE — Telephone Encounter (Signed)
Patient advised as below.  

## 2015-09-24 ENCOUNTER — Ambulatory Visit
Admission: RE | Admit: 2015-09-24 | Discharge: 2015-09-24 | Disposition: A | Discharge status: Home / Self Care | Payer: BLUE CROSS/BLUE SHIELD | Source: Ambulatory Visit | Attending: Family Medicine | Admitting: Family Medicine

## 2015-09-24 ENCOUNTER — Other Ambulatory Visit: Payer: Self-pay

## 2015-09-24 ENCOUNTER — Other Ambulatory Visit: Payer: Self-pay | Admitting: Family Medicine

## 2015-09-24 DIAGNOSIS — D72829 Elevated white blood cell count, unspecified: Secondary | ICD-10-CM

## 2015-09-24 DIAGNOSIS — R928 Other abnormal and inconclusive findings on diagnostic imaging of breast: Secondary | ICD-10-CM | POA: Diagnosis not present

## 2015-09-24 DIAGNOSIS — Z1231 Encounter for screening mammogram for malignant neoplasm of breast: Secondary | ICD-10-CM | POA: Diagnosis present

## 2015-09-25 ENCOUNTER — Telehealth: Payer: Self-pay

## 2015-09-25 LAB — CBC WITH DIFFERENTIAL/PLATELET
Basophils Absolute: 0 10*3/uL (ref 0.0–0.2)
Basos: 0 %
EOS (ABSOLUTE): 0.1 10*3/uL (ref 0.0–0.4)
Eos: 1 %
Hematocrit: 40.2 % (ref 34.0–46.6)
Hemoglobin: 13.7 g/dL (ref 11.1–15.9)
Immature Grans (Abs): 0 10*3/uL (ref 0.0–0.1)
Immature Granulocytes: 0 %
Lymphocytes Absolute: 2.2 10*3/uL (ref 0.7–3.1)
Lymphs: 22 %
MCH: 28 pg (ref 26.6–33.0)
MCHC: 34.1 g/dL (ref 31.5–35.7)
MCV: 82 fL (ref 79–97)
Monocytes Absolute: 0.7 10*3/uL (ref 0.1–0.9)
Monocytes: 7 %
Neutrophils Absolute: 7.1 10*3/uL — ABNORMAL HIGH (ref 1.4–7.0)
Neutrophils: 70 %
Platelets: 270 10*3/uL (ref 150–379)
RBC: 4.9 x10E6/uL (ref 3.77–5.28)
RDW: 14.9 % (ref 12.3–15.4)
WBC: 10.1 10*3/uL (ref 3.4–10.8)

## 2015-09-25 NOTE — Telephone Encounter (Signed)
-----   Message from Margaretann LovelessJennifer M Burnette, PA-C sent at 09/25/2015  9:16 AM EDT ----- WBC count is improving and now WNL. If wanted we can recheck in 3-4 weeks to make sure neutrophils return completely to normal as well.

## 2015-09-25 NOTE — Telephone Encounter (Signed)
We can recheck CBC and vitamin D in 3-4 months.

## 2015-09-25 NOTE — Telephone Encounter (Signed)
Heather LeatherwoodKatherine was advised as directed below. She is asking when does she needs to come back to follow-up on her vitamin D and that if you are not concern about her Neutrophils that she is not or that when we check for her vitamin levels again we can check the neutrophils that is up to the provider. Please advise.   Thanks,  -Keahi Mccarney

## 2015-09-25 NOTE — Telephone Encounter (Signed)
Pt is returning call.  CB#[phone]/MW

## 2015-09-25 NOTE — Telephone Encounter (Signed)
LMTCB  Thanks,  -Mazzie Brodrick 

## 2015-09-29 ENCOUNTER — Other Ambulatory Visit: Payer: Self-pay | Admitting: Physician Assistant

## 2015-09-29 DIAGNOSIS — N6310 Unspecified lump in the right breast, unspecified quadrant: Secondary | ICD-10-CM

## 2015-09-29 NOTE — Telephone Encounter (Signed)
L/M advising below. Advised patient to call back if any further questions.

## 2015-10-23 ENCOUNTER — Ambulatory Visit
Admission: RE | Admit: 2015-10-23 | Discharge: 2015-10-23 | Disposition: A | Discharge status: Home / Self Care | Payer: BLUE CROSS/BLUE SHIELD | Source: Ambulatory Visit | Attending: Physician Assistant | Admitting: Physician Assistant

## 2015-10-23 DIAGNOSIS — N63 Unspecified lump in breast: Secondary | ICD-10-CM | POA: Diagnosis not present

## 2015-10-23 DIAGNOSIS — N6310 Unspecified lump in the right breast, unspecified quadrant: Secondary | ICD-10-CM

## 2015-11-02 ENCOUNTER — Telehealth: Payer: Self-pay | Admitting: Physician Assistant

## 2015-11-02 DIAGNOSIS — N6001 Solitary cyst of right breast: Secondary | ICD-10-CM

## 2015-11-02 NOTE — Telephone Encounter (Signed)
OK to order right breast ultrasound

## 2015-11-02 NOTE — Telephone Encounter (Signed)
Ok to order Ultrasound for the patient? Please advise. Thanks!

## 2015-11-02 NOTE — Telephone Encounter (Signed)
Order has been placed. L/M advising patient.  

## 2015-11-02 NOTE — Telephone Encounter (Signed)
Pt stated [place] advised pt that she should follow up in 6 months with another ultrasound. Pt stated they won't schedule the appt for 6 months until they receive an order. Pt was advised Antony ContrasJenni is out of the office. Please advise. Thanks TNP

## 2016-04-25 ENCOUNTER — Ambulatory Visit
Admission: RE | Admit: 2016-04-25 | Discharge: 2016-04-25 | Disposition: A | Discharge status: Home / Self Care | Payer: BLUE CROSS/BLUE SHIELD | Source: Ambulatory Visit | Attending: Family Medicine | Admitting: Family Medicine

## 2016-04-25 DIAGNOSIS — N6001 Solitary cyst of right breast: Secondary | ICD-10-CM | POA: Insufficient documentation

## 2016-05-16 ENCOUNTER — Encounter: Payer: BLUE CROSS/BLUE SHIELD | Admitting: Physician Assistant

## 2016-05-25 ENCOUNTER — Encounter: Payer: Self-pay | Admitting: Physician Assistant

## 2016-05-25 ENCOUNTER — Ambulatory Visit (INDEPENDENT_AMBULATORY_CARE_PROVIDER_SITE_OTHER): Payer: BLUE CROSS/BLUE SHIELD | Admitting: Physician Assistant

## 2016-05-25 VITALS — BP 134/80 | HR 88 | Temp 98.2°F | Resp 16 | Ht 62.0 in | Wt 246.4 lb

## 2016-05-25 DIAGNOSIS — Z0000 Encounter for general adult medical examination without abnormal findings: Secondary | ICD-10-CM

## 2016-05-25 DIAGNOSIS — IMO0001 Reserved for inherently not codable concepts without codable children: Secondary | ICD-10-CM | POA: Insufficient documentation

## 2016-05-25 DIAGNOSIS — Z6841 Body Mass Index (BMI) 40.0 and over, adult: Secondary | ICD-10-CM | POA: Diagnosis not present

## 2016-05-25 DIAGNOSIS — Z114 Encounter for screening for human immunodeficiency virus [HIV]: Secondary | ICD-10-CM | POA: Diagnosis not present

## 2016-05-25 DIAGNOSIS — R7303 Prediabetes: Secondary | ICD-10-CM | POA: Diagnosis not present

## 2016-05-25 DIAGNOSIS — E6609 Other obesity due to excess calories: Secondary | ICD-10-CM

## 2016-05-25 DIAGNOSIS — E559 Vitamin D deficiency, unspecified: Secondary | ICD-10-CM

## 2016-05-25 DIAGNOSIS — Z136 Encounter for screening for cardiovascular disorders: Secondary | ICD-10-CM

## 2016-05-25 DIAGNOSIS — Z13220 Encounter for screening for lipoid disorders: Secondary | ICD-10-CM | POA: Diagnosis not present

## 2016-05-25 NOTE — Progress Notes (Signed)
Patient: Heather Hester, Female    DOB: [birth year], 46 y.o.   MRN: [id number] Visit Date: 05/27/2016  Today's Provider: Margaretann Loveless, PA-C   Chief Complaint  Patient presents with  . Annual Exam   Subjective:    Annual physical exam Heather Hester is a 46 y.o. female who presents today for health maintenance and complete physical. She feels well. She reports exercising 5 days a week. She reports she is sleeping well. She has lost 15 pounds with lifestyle modifications and states she is feeling great.  03/13/15 CPE 11/28/12 Pap-neg, HPV-neg 04/25/16 Mammogram-BI-RADS 2, Right breast US with cyst noted. -----------------------------------------------------------------  Review of Systems  Constitutional: Negative.   HENT: Positive for rhinorrhea.   Eyes: Negative.   Respiratory: Positive for cough.   Cardiovascular: Negative.   Gastrointestinal: Negative.   Endocrine: Negative.   Genitourinary: Negative.   Musculoskeletal: Negative.   Skin: Negative.   Allergic/Immunologic: Negative.   Neurological: Negative.   Hematological: Negative.   Psychiatric/Behavioral: Negative.     Social History      She  reports that she has never smoked. She has never used smokeless tobacco. She reports that she does not drink alcohol or use drugs.       Social History   Social History  . Marital status: Single    Spouse name: N/A  . Number of children: 0  . Years of education: HS   Occupational History  . [employer]   Social History Main Topics  . Smoking status: Never Smoker  . Smokeless tobacco: Never Used  . Alcohol use No  . Drug use: No  . Sexual activity: Not Asked   Other Topics Concern  . None   Social History Narrative  . None    No past medical history on file.   Patient Active Problem List   Diagnosis Date Noted  . Class 3 obesity due to excess calories without serious comorbidity with body mass index  (BMI) of 45.0 to 49.9 in adult (HCC) 05/25/2016  . Vitamin D deficiency 08/13/2014  . Body mass index (BMI) 45.0-49.9, adult (HCC) 06/26/2014  . Idiopathic peripheral neuropathy 06/26/2014  . Burning or prickling sensation 06/26/2014  . Borderline diabetes 06/26/2014    Past Surgical History:  Procedure Laterality Date  . BREAST BIOPSY Right 09/23/2007   core bx with clip  . CARPAL TUNNEL RELEASE Left 2010    Family History        Family Status  Relation Status  . Mother Alive  . Father Deceased  . Brother Alive  . Neg Hx         Her family history includes COPD in her mother; Depression in her mother; Hypertension in her brother; Kidney disease in her father; Sleep apnea in her brother.     No Known Allergies   Current Outpatient Prescriptions:  .  Cholecalciferol (VITAMIN D) 2000 UNITS CAPS, Take 1 capsule (2,000 Units total) by mouth daily. (Patient taking differently: Take 1 capsule by mouth at bedtime. ), Disp: 30 capsule, Rfl:    Patient Care Team: Lorie Phenix, MD as PCP - General (Family Medicine)      Objective:   Vitals: BP 134/80 (BP Location: Right Arm, Patient Position: Sitting, Cuff Size: Large)   Pulse 88   Temp 98.2 F (36.8 C) (Oral)   Resp 16   Ht 5\' 2"  (1.575 m)   Wt 246 lb 6.4  oz (111.8 kg)   LMP 05/18/2016 (Exact Date)   SpO2 98%   BMI 45.07 kg/m    Vitals:   05/25/16 1510  BP: 134/80  Pulse: 88  Resp: 16  Temp: 98.2 F (36.8 C)  TempSrc: Oral  SpO2: 98%  Weight: 246 lb 6.4 oz (111.8 kg)  Height: 5\' 2"  (1.575 m)     Physical Exam  Constitutional: She is oriented to person, place, and time. She appears well-developed and well-nourished. No distress.  HENT:  Head: Normocephalic and atraumatic.  Right Ear: Hearing, tympanic membrane, external ear and ear canal normal.  Left Ear: Hearing, tympanic membrane, external ear and ear canal normal.  Nose: Nose normal. Right sinus exhibits no maxillary sinus tenderness and no frontal sinus  tenderness. Left sinus exhibits no maxillary sinus tenderness and no frontal sinus tenderness.  Mouth/Throat: Uvula is midline and oropharynx is clear and moist. No oropharyngeal exudate.  Eyes: Conjunctivae and EOM are normal. Pupils are equal, round, and reactive to light. Right eye exhibits no discharge. Left eye exhibits no discharge. No scleral icterus.  Neck: Normal range of motion. Neck supple. No JVD present. No tracheal deviation present. No thyromegaly present.  Cardiovascular: Normal rate, regular rhythm, normal heart sounds and intact distal pulses.  Exam reveals no gallop and no friction rub.   No murmur heard. Pulmonary/Chest: Effort normal and breath sounds normal. No respiratory distress. She has no wheezes. She has no rales. She exhibits no tenderness.  Abdominal: Soft. Bowel sounds are normal. She exhibits no distension and no mass. There is no tenderness. There is no rebound and no guarding.  Genitourinary:  Genitourinary Comments: Patient refused breast and pelvic exam today  Musculoskeletal: Normal range of motion. She exhibits no edema or tenderness.  Lymphadenopathy:    She has no cervical adenopathy.  Neurological: She is alert and oriented to person, place, and time.  Skin: Skin is warm and dry. No rash noted. She is not diaphoretic.  Psychiatric: She has a normal mood and affect. Her behavior is normal. Judgment and thought content normal.  Vitals reviewed.    Depression Screen PHQ 2/9 Scores 05/25/2016 03/13/2015  PHQ - 2 Score 0 0  PHQ- 9 Score 0 -   Audit-C Alcohol Use Screening  Question Answer Points  How often do you have alcoholic drink? never 0  On days you do drink alcohol, how many drinks do you typically consume? 0 0  How oftey will you drink 6 or more in a total? never 0  Total Score:  0   A score of 3 or more in women, and 4 or more in men indicates increased risk for alcohol abuse, EXCEPT if all of the points are from question 1. Current Exercise  Habits: Structured exercise class, Type of exercise: strength training/weights;treadmill, Time (Minutes): 45, Frequency (Times/Week): 5, Weekly Exercise (Minutes/Week): 225, Intensity: Mild     Assessment & Plan:     Routine Health Maintenance and Physical Exam  Exercise Activities and Dietary recommendations Goals    . diet          Going to due formal diet.     . Exercise 150 minutes per week (moderate activity)    . Exercise 150 minutes per week (moderate activity)       Immunization History  Administered Date(s) Administered  . Tdap 11/28/2012    Health Maintenance  Topic Date Due  . PAP SMEAR  11/29/2015  . TETANUS/TDAP  11/29/2022  . INFLUENZA VACCINE  Completed  . HIV Screening  Completed     Discussed health benefits of physical activity, and encouraged her to engage in regular exercise appropriate for her age and condition.    1. Annual physical exam Normal physical exam today. Will check labs as below and f/u pending lab results. If labs are stable and WNL she will not need to have these rechecked for one year at her next annual physical exam. She is to call the office in the meantime if she has any acute issue, questions or concerns. - CBC w/Diff/Platelet - Comprehensive Metabolic Panel (CMET) - TSH  2. Screening for HIV (human immunodeficiency virus) - HIV antibody (with reflex)  3. Vitamin D deficiency H/O this and on supplementation. Will check labs as below and f/u pending results. - Vitamin D (25 hydroxy)  4. Borderline diabetes H/O elevated A1c showing prediabetes. Will check labs as below and f/u pending results. - Comprehensive Metabolic Panel (CMET) - HgB A1c  5. Encounter for lipid screening for cardiovascular disease Previously elevated triglycerides. Will check labs as below and f/u pending results. - Lipid Profile  6. Class 3 obesity due to excess calories without serious comorbidity with body mass index (BMI) of 45.0 to 49.9 in adult  [facility](HCC) Patient has been changing diet, eating healthier and decreased portion size, and increasing physical activity. She has lost 15 pounds since last being seen here.   --------------------------------------------------------------------    Margaretann LovelessJennifer M Sherre Wooton, PA-C  [facility]

## 2016-05-25 NOTE — Patient Instructions (Signed)
Health Maintenance, Female Adopting a healthy lifestyle and getting preventive care can go a long way to promote health and wellness. Talk with your health care provider about what schedule of regular examinations is right for you. This is a good chance for you to check in with your provider about disease prevention and staying healthy. In between checkups, there are plenty of things you can do on your own. Experts have done a lot of research about which lifestyle changes and preventive measures are most likely to keep you healthy. Ask your health care provider for more information. Weight and diet Eat a healthy diet  Be sure to include plenty of vegetables, fruits, low-fat dairy products, and lean protein.  Do not eat a lot of foods high in solid fats, added sugars, or salt.  Get regular exercise. This is one of the most important things you can do for your health.  Most adults should exercise for at least 150 minutes each week. The exercise should increase your heart rate and make you sweat (moderate-intensity exercise).  Most adults should also do strengthening exercises at least twice a week. This is in addition to the moderate-intensity exercise. Maintain a healthy weight  Body mass index (BMI) is a measurement that can be used to identify possible weight problems. It estimates body fat based on height and weight. Your health care provider can help determine your BMI and help you achieve or maintain a healthy weight.  For females 76 years of age and older:  A BMI below 18.5 is considered underweight.  A BMI of 18.5 to 24.9 is normal.  A BMI of 25 to 29.9 is considered overweight.  A BMI of 30 and above is considered obese. Watch levels of cholesterol and blood lipids  You should start having your blood tested for lipids and cholesterol at 47 years of age, then have this test every 5 years.  You may need to have your cholesterol levels checked more often if:  Your lipid or  cholesterol levels are high.  You are older than 47 years of age.  You are at high risk for heart disease. Cancer screening Lung Cancer  Lung cancer screening is recommended for adults 64-47 years old who are at high risk for lung cancer because of a history of smoking.  A yearly low-dose CT scan of the lungs is recommended for people who:  Currently smoke.  Have quit within the past 15 years.  Have at least a 30-pack-year history of smoking. A pack year is smoking an average of one pack of cigarettes a day for 1 year.  Yearly screening should continue until it has been 15 years since you quit.  Yearly screening should stop if you develop a health problem that would prevent you from having lung cancer treatment. Breast Cancer  Practice breast self-awareness. This means understanding how your breasts normally appear and feel.  It also means doing regular breast self-exams. Let your health care provider know about any changes, no matter how small.  If you are in your 47s, you should have a clinical breast exam (CBE) by a health care provider every 1-3 years as part of a regular health exam.  If you are 47 or older, have a CBE every year. Also consider having a breast X-ray (mammogram) every year.  If you have a family history of breast cancer, talk to your health care provider about genetic screening.  If you are at high risk for breast cancer, talk  to your health care provider about having an MRI and a mammogram every year.  Breast cancer gene (BRCA) assessment is recommended for women who have family members with BRCA-related cancers. BRCA-related cancers include:  Breast.  Ovarian.  Tubal.  Peritoneal cancers.  Results of the assessment will determine the need for genetic counseling and BRCA1 and BRCA2 testing. Cervical Cancer  Your health care provider may recommend that you be screened regularly for cancer of the pelvic organs (ovaries, uterus, and vagina).  This screening involves a pelvic examination, including checking for microscopic changes to the surface of your cervix (Pap test). You may be encouraged to have this screening done every 3 years, beginning at age 47.  For women ages 47-65, health care providers may recommend pelvic exams and Pap testing every 3 years, or they may recommend the Pap and pelvic exam, combined with testing for human papilloma virus (HPV), every 5 years. Some types of HPV increase your risk of cervical cancer. Testing for HPV may also be done on women of any age with unclear Pap test results.  Other health care providers may not recommend any screening for nonpregnant women who are considered low risk for pelvic cancer and who do not have symptoms. Ask your health care provider if a screening pelvic exam is right for you.  If you have had past treatment for cervical cancer or a condition that could lead to cancer, you need Pap tests and screening for cancer for at least 20 years after your treatment. If Pap tests have been discontinued, your risk factors (such as having a new sexual partner) need to be reassessed to determine if screening should resume. Some women have medical problems that increase the chance of getting cervical cancer. In these cases, your health care provider may recommend more frequent screening and Pap tests. Colorectal Cancer  This type of cancer can be detected and often prevented.  Routine colorectal cancer screening usually begins at 47 years of age and continues through 47 years of age.  Your health care provider may recommend screening at an earlier age if you have risk factors for colon cancer.  Your health care provider may also recommend using home test kits to check for hidden blood in the stool.  A small camera at the end of a tube can be used to examine your colon directly (sigmoidoscopy or colonoscopy). This is done to check for the earliest forms of colorectal cancer.  Routine  screening usually begins at age 47.  Direct examination of the colon should be repeated every 5-10 years through 47 years of age. However, you may need to be screened more often if early forms of precancerous polyps or small growths are found. Skin Cancer  Check your skin from head to toe regularly.  Tell your health care provider about any new moles or changes in moles, especially if there is a change in a mole's shape or color.  Also tell your health care provider if you have a mole that is larger than the size of a pencil eraser.  Always use sunscreen. Apply sunscreen liberally and repeatedly throughout the day.  Protect yourself by wearing long sleeves, pants, a wide-brimmed hat, and sunglasses whenever you are outside. Heart disease, diabetes, and high blood pressure  High blood pressure causes heart disease and increases the risk of stroke. High blood pressure is more likely to develop in:  People who have blood pressure in the high end of the normal range (130-139/85-89 mm Hg).  People who are overweight or obese.  People who are African American.  If you are 59-24 years of age, have your blood pressure checked every 3-5 years. If you are 34 years of age or older, have your blood pressure checked every year. You should have your blood pressure measured twice-once when you are at a hospital or clinic, and once when you are not at a hospital or clinic. Record the average of the two measurements. To check your blood pressure when you are not at a hospital or clinic, you can use:  An automated blood pressure machine at a pharmacy.  A home blood pressure monitor.  If you are between 29 years and 60 years old, ask your health care provider if you should take aspirin to prevent strokes.  Have regular diabetes screenings. This involves taking a blood sample to check your fasting blood sugar level.  If you are at a normal weight and have a low risk for diabetes, have this test once  every three years after 47 years of age.  If you are overweight and have a high risk for diabetes, consider being tested at a younger age or more often. Preventing infection Hepatitis B  If you have a higher risk for hepatitis B, you should be screened for this virus. You are considered at high risk for hepatitis B if:  You were born in a country where hepatitis B is common. Ask your health care provider which countries are considered high risk.  Your parents were born in a high-risk country, and you have not been immunized against hepatitis B (hepatitis B vaccine).  You have HIV or AIDS.  You use needles to inject street drugs.  You live with someone who has hepatitis B.  You have had sex with someone who has hepatitis B.  You get hemodialysis treatment.  You take certain medicines for conditions, including cancer, organ transplantation, and autoimmune conditions. Hepatitis C  Blood testing is recommended for:  Everyone born from [birth year] through 1966.  Anyone with known risk factors for hepatitis C. Sexually transmitted infections (STIs)  You should be screened for sexually transmitted infections (STIs) including gonorrhea and chlamydia if:  You are sexually active and are younger than 47 years of age.  You are older than 47 years of age and your health care provider tells you that you are at risk for this type of infection.  Your sexual activity has changed since you were last screened and you are at an increased risk for chlamydia or gonorrhea. Ask your health care provider if you are at risk.  If you do not have HIV, but are at risk, it may be recommended that you take a prescription medicine daily to prevent HIV infection. This is called pre-exposure prophylaxis (PrEP). You are considered at risk if:  You are sexually active and do not regularly use condoms or know the HIV status of your partner(s).  You take drugs by injection.  You are sexually active with a partner  who has HIV. Talk with your health care provider about whether you are at high risk of being infected with HIV. If you choose to begin PrEP, you should first be tested for HIV. You should then be tested every 3 months for as long as you are taking PrEP. Pregnancy  If you are premenopausal and you may become pregnant, ask your health care provider about preconception counseling.  If you may become pregnant, take 400 to 800 micrograms (mcg) of folic acid  every day.  If you want to prevent pregnancy, talk to your health care provider about birth control (contraception). Osteoporosis and menopause  Osteoporosis is a disease in which the bones lose minerals and strength with aging. This can result in serious bone fractures. Your risk for osteoporosis can be identified using a bone density scan.  If you are 4 years of age or older, or if you are at risk for osteoporosis and fractures, ask your health care provider if you should be screened.  Ask your health care provider whether you should take a calcium or vitamin D supplement to lower your risk for osteoporosis.  Menopause may have certain physical symptoms and risks.  Hormone replacement therapy may reduce some of these symptoms and risks. Talk to your health care provider about whether hormone replacement therapy is right for you. Follow these instructions at home:  Schedule regular health, dental, and eye exams.  Stay current with your immunizations.  Do not use any tobacco products including cigarettes, chewing tobacco, or electronic cigarettes.  If you are pregnant, do not drink alcohol.  If you are breastfeeding, limit how much and how often you drink alcohol.  Limit alcohol intake to no more than 1 drink per day for nonpregnant women. One drink equals 12 ounces of beer, 5 ounces of wine, or 1 ounces of hard liquor.  Do not use street drugs.  Do not share needles.  Ask your health care provider for help if you need support  or information about quitting drugs.  Tell your health care provider if you often feel depressed.  Tell your health care provider if you have ever been abused or do not feel safe at home. This information is not intended to replace advice given to you by your health care provider. Make sure you discuss any questions you have with your health care provider. Document Released: 09/06/2010 Document Revised: 07/30/2015 Document Reviewed: 11/25/2014 Elsevier Interactive Patient Education  2017 [employer].

## 2016-05-26 ENCOUNTER — Telehealth: Payer: Self-pay

## 2016-05-26 LAB — CBC WITH DIFFERENTIAL/PLATELET
Basophils Absolute: 0 10*3/uL (ref 0.0–0.2)
Basos: 0 %
EOS (ABSOLUTE): 0.1 10*3/uL (ref 0.0–0.4)
Eos: 1 %
Hematocrit: 43.7 % (ref 34.0–46.6)
Hemoglobin: 14.2 g/dL (ref 11.1–15.9)
Immature Grans (Abs): 0 10*3/uL (ref 0.0–0.1)
Immature Granulocytes: 0 %
Lymphocytes Absolute: 1.5 10*3/uL (ref 0.7–3.1)
Lymphs: 21 %
MCH: 27.5 pg (ref 26.6–33.0)
MCHC: 32.5 g/dL (ref 31.5–35.7)
MCV: 85 fL (ref 79–97)
Monocytes Absolute: 0.6 10*3/uL (ref 0.1–0.9)
Monocytes: 9 %
Neutrophils Absolute: 5 10*3/uL (ref 1.4–7.0)
Neutrophils: 69 %
Platelets: 249 10*3/uL (ref 150–379)
RBC: 5.17 x10E6/uL (ref 3.77–5.28)
RDW: 15.1 % (ref 12.3–15.4)
WBC: 7.3 10*3/uL (ref 3.4–10.8)

## 2016-05-26 LAB — COMPREHENSIVE METABOLIC PANEL
ALT: 26 IU/L (ref 0–32)
AST: 17 IU/L (ref 0–40)
Albumin/Globulin Ratio: 1.7 (ref 1.2–2.2)
Albumin: 4.3 g/dL (ref 3.5–5.5)
Alkaline Phosphatase: 71 IU/L (ref 39–117)
BUN/Creatinine Ratio: 10 (ref 9–23)
BUN: 9 mg/dL (ref 6–24)
Bilirubin Total: 0.3 mg/dL (ref 0.0–1.2)
CO2: 26 mmol/L (ref 18–29)
Calcium: 9.1 mg/dL (ref 8.7–10.2)
Chloride: 101 mmol/L (ref 96–106)
Creatinine, Ser: 0.87 mg/dL (ref 0.57–1.00)
GFR calc Af Amer: 92 mL/min/{1.73_m2} (ref >59)
GFR calc non Af Amer: 80 mL/min/{1.73_m2} (ref >59)
Globulin, Total: 2.6 g/dL (ref 1.5–4.5)
Glucose: 84 mg/dL (ref 65–99)
Potassium: 4.2 mmol/L (ref 3.5–5.2)
Sodium: 144 mmol/L (ref 134–144)
Total Protein: 6.9 g/dL (ref 6.0–8.5)

## 2016-05-26 LAB — HEMOGLOBIN A1C
Est. average glucose Bld gHb Est-mCnc: 105 mg/dL
Hgb A1c MFr Bld: 5.3 % (ref 4.8–5.6)

## 2016-05-26 LAB — LIPID PANEL
Chol/HDL Ratio: 3 ratio units (ref 0.0–4.4)
Cholesterol, Total: 182 mg/dL (ref 100–199)
HDL: 61 mg/dL (ref >39)
LDL Calculated: 101 mg/dL — ABNORMAL HIGH (ref 0–99)
Triglycerides: 101 mg/dL (ref 0–149)
VLDL Cholesterol Cal: 20 mg/dL (ref 5–40)

## 2016-05-26 LAB — TSH: TSH: 1.75 u[IU]/mL (ref 0.450–4.500)

## 2016-05-26 LAB — VITAMIN D 25 HYDROXY (VIT D DEFICIENCY, FRACTURES): Vit D, 25-Hydroxy: 39.1 ng/mL (ref 30.0–100.0)

## 2016-05-26 LAB — HIV ANTIBODY (ROUTINE TESTING W REFLEX): HIV Screen 4th Generation wRfx: NONREACTIVE

## 2016-05-26 NOTE — Telephone Encounter (Signed)
LMTCB  Thanks,  -Kaleem Sartwell 

## 2016-05-26 NOTE — Telephone Encounter (Signed)
-----   Message from Margaretann LovelessJennifer M Burnette, PA-C sent at 05/26/2016  8:30 AM EDT ----- Cholesterol and A1c are much improved with lifestyle changes! Continue the good work! All labs are WNL.

## 2016-05-26 NOTE — Telephone Encounter (Signed)
Patient advised as directed below.  Thanks,  -Yeison Sippel 

## 2016-09-23 ENCOUNTER — Ambulatory Visit (INDEPENDENT_AMBULATORY_CARE_PROVIDER_SITE_OTHER): Payer: BLUE CROSS/BLUE SHIELD

## 2016-09-23 ENCOUNTER — Ambulatory Visit (INDEPENDENT_AMBULATORY_CARE_PROVIDER_SITE_OTHER): Payer: BLUE CROSS/BLUE SHIELD | Admitting: Podiatry

## 2016-09-23 VITALS — BP 143/81 | HR 89 | Temp 99.0°F | Resp 16

## 2016-09-23 DIAGNOSIS — M722 Plantar fascial fibromatosis: Secondary | ICD-10-CM | POA: Diagnosis not present

## 2016-09-23 DIAGNOSIS — B351 Tinea unguium: Secondary | ICD-10-CM

## 2016-09-23 DIAGNOSIS — Z79899 Other long term (current) drug therapy: Secondary | ICD-10-CM

## 2016-09-26 ENCOUNTER — Telehealth: Payer: Self-pay | Admitting: Podiatry

## 2016-09-26 MED ORDER — TERBINAFINE HCL 250 MG PO TABS: 250.0000 mg | ORAL_TABLET | Freq: Every day | ORAL | 1 refills | Status: DC

## 2016-09-26 NOTE — Telephone Encounter (Signed)
Rx has been sent to pharmacy

## 2016-09-26 NOTE — Telephone Encounter (Signed)
Yes I saw Dr. Logan BoresEvans on Friday and was supposed to haven an Rx sent in for possible toenail fungus. I went to pick it up at the pharmacy and they didn't have anything. I went to look at the paperwork, but the only thing it looks like I have is to go get lab work done. If you could, please call me back at [phone] which is my work number. If I do not answer, you can leave me a voicemail and I will call you back.

## 2016-09-27 LAB — HEPATIC FUNCTION PANEL
ALT: 20 IU/L (ref 0–32)
AST: 14 IU/L (ref 0–40)
Albumin: 4.3 g/dL (ref 3.5–5.5)
Alkaline Phosphatase: 78 IU/L (ref 39–117)
Bilirubin Total: 0.2 mg/dL (ref 0.0–1.2)
Bilirubin, Direct: 0.09 mg/dL (ref 0.00–0.40)
Total Protein: 7.1 g/dL (ref 6.0–8.5)

## 2016-10-01 NOTE — Progress Notes (Signed)
   Subjective: Patient presents today for pain and tenderness in the feet bilaterally. Patient states the foot pain has been hurting for several weeks now. Patient states that it hurts in the mornings with the first steps out of bed. Patient presents today for further treatment and evaluation Patient also complains of a thickened discolored dystrophic toenail to the right great toe. This been ongoing for several years. Patient denies trauma to the right great toe.   Objective: Physical Exam General: The patient is alert and oriented x3 in no acute distress.  Dermatology: Mild dystrophic thickened toenail noted to the right great toe. Skin is warm, dry and supple bilateral lower extremities. Negative for open lesions or macerations bilateral.   Vascular: Dorsalis Pedis and Posterior Tibial pulses palpable bilateral.  Capillary fill time is immediate to all digits.  Neurological: Epicritic and protective threshold intact bilateral.   Musculoskeletal: Tenderness to palpation at the medial calcaneal tubercale and through the insertion of the plantar fascia of the bilateral feet. All other joints range of motion within normal limits bilateral. Strength 5/5 in all groups bilateral.   Radiographic exam: Normal osseous mineralization. Joint spaces preserved. No fracture/dislocation/boney destruction. Calcaneal spur present with mild thickening of plantar fascia bilateral. No other soft tissue abnormalities or radiopaque foreign bodies.   Assessment: 1. plantar fasciitis bilateral feet 2. Fungal nail infection right great toe  Plan of Care:  1. Patient evaluated. Xrays reviewed.   2. Today were going to dispense plantar fascial braces to the bilateral feet. Her plantar fasciitis flares and is intermittent and she does not want anti-inflammatory injections at this time. 3. Orders for liver function tests placed. 4. Prescription for Lamisil therapy milligrams #90 5. [facility] 4. Return to clinic when necessary  Felecia ShellingBrent M. Evans, DPM [facility]  Dr. Felecia ShellingBrent M. Evans, DPM    [address]                                   [place], [place] [id number]                Office [phone]  Fax [phone]

## 2016-10-03 ENCOUNTER — Ambulatory Visit (INDEPENDENT_AMBULATORY_CARE_PROVIDER_SITE_OTHER): Payer: BLUE CROSS/BLUE SHIELD | Admitting: Physician Assistant

## 2016-10-03 ENCOUNTER — Encounter: Payer: Self-pay | Admitting: Physician Assistant

## 2016-10-03 VITALS — BP 122/78 | HR 85 | Temp 98.7°F | Resp 16 | Wt 255.2 lb

## 2016-10-03 DIAGNOSIS — L237 Allergic contact dermatitis due to plants, except food: Secondary | ICD-10-CM | POA: Diagnosis not present

## 2016-10-03 MED ORDER — PREDNISONE 10 MG (21) PO TBPK: ORAL_TABLET | ORAL | 0 refills | Status: DC

## 2016-10-03 NOTE — Progress Notes (Signed)
       Patient: Heather OrionCatherine L Loeb Female    DOB: 1970-07-17   46 y.o.   MRN: [id number] Visit Date: 10/03/2016  Today's Provider: Margaretann LovelessJennifer M Trinitee Horgan, PA-C   Chief Complaint  Patient presents with  . Rash   Subjective:    Rash  This is a new problem. The current episode started 1 to 4 weeks ago (2 weeks ago). The problem has been gradually worsening since onset. The affected locations include the left arm, left lower leg and right lower leg. The rash is characterized by redness and itchiness. She was exposed to plant contact. Past treatments include anti-itch cream (ice). The treatment provided no relief.  She reports she had mowed and felt it was thrown from the mower up onto her.     No Known Allergies   Current Outpatient Prescriptions:  .  Cholecalciferol (VITAMIN D) 2000 UNITS CAPS, Take 1 capsule (2,000 Units total) by mouth daily. (Patient taking differently: Take 1 capsule by mouth at bedtime. ), Disp: 30 capsule, Rfl:  .  terbinafine (LAMISIL) 250 MG tablet, Take 1 tablet (250 mg total) by mouth daily., Disp: 30 tablet, Rfl: 1  Review of Systems  Constitutional: Negative.   Respiratory: Negative.   Cardiovascular: Negative.   Skin: Positive for rash.  Neurological: Negative.     Social History  Substance Use Topics  . Smoking status: Never Smoker  . Smokeless tobacco: Never Used  . Alcohol use No   Objective:   BP 122/78 (BP Location: Right Arm, Patient Position: Sitting, Cuff Size: Normal)   Pulse 85   Temp 98.7 F (37.1 C) (Oral)   Resp 16   Wt 255 lb 3.2 oz (115.8 kg)   BMI 46.68 kg/m     Physical Exam  Constitutional: She appears well-developed and well-nourished.  Skin: Rash noted. Rash is vesicular.     Vitals reviewed.       Assessment & Plan:     1. Allergic contact dermatitis due to plants, except food Worsening. Will treat with 6 day taper as below. Continue hydrocortisone cream. Add calamine lotion. Add benadryl prn at bedtime for  itching. Call if no improvements.  - predniSONE (STERAPRED UNI-PAK 21 TAB) 10 MG (21) TBPK tablet; Take as directed on package instructions  Dispense: 21 tablet; Refill: 0       Margaretann LovelessJennifer M Hailei Besser, PA-C  [facility]

## 2016-10-03 NOTE — Patient Instructions (Signed)
Poison Oak Dermatitis  Poison oak dermatitis is inflammation of the skin that is caused by contact with the allergens on the leaves of the poison oak (toxicodendron) plant. The skin reaction often includes redness, swelling, blisters, and extreme itching.  What are the causes?  This condition is caused by a specific chemical (urushiol) that is found in the sap of the poison oak plant. This chemical is sticky and it can be easily spread to people, animals, and objects. You can get poison oak dermatitis by:  · Having direct contact with a poison oak plant.  · Touching animals, other people, or objects that have come in contact with poison oak and have the chemical on them.    What increases the risk?  This condition is more likely to develop in people who:  · Are outdoors often.  · Go outdoors without wearing protective clothing, such as closed shoes, long pants, and a long-sleeved shirt.    What are the signs or symptoms?  Symptoms of this condition include:  · Redness of the skin.  · A rash that may develop blisters.  · Extreme itching.  · Swelling. This may occur if the reaction is more severe.    Symptoms usually last for 1-2 weeks. However, the first time you develop this condition, symptoms may last 3-4 weeks.  How is this diagnosed?  This condition may be diagnosed based on your symptoms and a physical exam. Your health care provider may also ask you about any recent outdoor activity.  How is this treated?  Treatment for this condition will vary depending on how severe it is. Treatment may include:  · Hydrocortisone creams or calamine lotions to relieve itching.  · Oatmeal baths to soothe the skin.  · Over-the-counter antihistamine tablets.  · Oral steroid medicine for more severe outbreaks.    Follow these instructions at home:  · Take or apply over-the-counter and prescription medicines only as told by your health care provider.  · Wash exposed skin as soon as possible with soap and cold water.  · Use  hydrocortisone creams or calamine lotion as needed to soothe the skin and relieve itching.  · Take oatmeal baths as needed. Use colloidal oatmeal. You can get this at your local pharmacy or grocery store. Follow the instructions on the packaging.  · Do not scratch or rub your skin.  · While you have the rash, wash clothes right after you wear them.  How is this prevented?  · Learn to identify the poison oak plant and avoid contact with the plant. This plant can be recognized by the number of leaves. Generally, poison oak has three leaves with flowering branches on a single stem. The leaves are often a bit fuzzy and have a toothlike edge.  · If you have been exposed to poison oak, thoroughly wash with soap and water right away. You have about 30 minutes to remove the plant resin before it will cause the rash. Be sure to wash under your fingernails because any plant resin there will continue to spread the rash.  · When hiking or camping, wear clothes that will help you avoid exposure on the skin. This includes long pants, a long-sleeved shirt, tall socks, and hiking boots. You can also apply preventive lotion to your skin to help limit exposure.  · If you suspect that your clothes or outdoor gear came in contact with poison oak, rinse them off outside with a garden hose before bringing them inside your house.    Contact a health care provider if:  · You have open sores in the rash area.  · You have more redness, swelling, or pain in the affected area.  · You have redness that spreads beyond the rash area.  · You have fluid, blood, or pus coming from the affected area.  · You have a fever.  · You have a rash over a large area of your body.  · You have a rash on your eyes, mouth, or genitals.  · Your rash does not improve after a few days.  Get help right away if:  · Your face swells or your eyes swell shut.  · You have trouble breathing.  · You have trouble swallowing.  This information is not intended to replace advice  given to you by your health care provider. Make sure you discuss any questions you have with your health care provider.  Document Released: 08/28/2002 Document Revised: 07/30/2015 Document Reviewed: 07/30/2014  Elsevier Interactive Patient Education © 2018 Elsevier Inc.

## 2016-11-24 ENCOUNTER — Ambulatory Visit (INDEPENDENT_AMBULATORY_CARE_PROVIDER_SITE_OTHER): Payer: BLUE CROSS/BLUE SHIELD

## 2016-11-24 DIAGNOSIS — Z23 Encounter for immunization: Secondary | ICD-10-CM

## 2017-05-26 ENCOUNTER — Encounter: Payer: Self-pay | Admitting: Physician Assistant

## 2017-05-26 ENCOUNTER — Ambulatory Visit (INDEPENDENT_AMBULATORY_CARE_PROVIDER_SITE_OTHER): Payer: BLUE CROSS/BLUE SHIELD | Admitting: Physician Assistant

## 2017-05-26 VITALS — BP 118/88 | HR 87 | Temp 98.5°F | Resp 16 | Ht 62.0 in | Wt 258.8 lb

## 2017-05-26 DIAGNOSIS — Z1231 Encounter for screening mammogram for malignant neoplasm of breast: Secondary | ICD-10-CM | POA: Diagnosis not present

## 2017-05-26 DIAGNOSIS — Z124 Encounter for screening for malignant neoplasm of cervix: Secondary | ICD-10-CM

## 2017-05-26 DIAGNOSIS — Z6841 Body Mass Index (BMI) 40.0 and over, adult: Secondary | ICD-10-CM | POA: Diagnosis not present

## 2017-05-26 DIAGNOSIS — Z872 Personal history of diseases of the skin and subcutaneous tissue: Secondary | ICD-10-CM

## 2017-05-26 DIAGNOSIS — Z0000 Encounter for general adult medical examination without abnormal findings: Secondary | ICD-10-CM | POA: Diagnosis not present

## 2017-05-26 DIAGNOSIS — Z136 Encounter for screening for cardiovascular disorders: Secondary | ICD-10-CM

## 2017-05-26 DIAGNOSIS — Z13220 Encounter for screening for lipoid disorders: Secondary | ICD-10-CM | POA: Diagnosis not present

## 2017-05-26 DIAGNOSIS — R7303 Prediabetes: Secondary | ICD-10-CM

## 2017-05-26 DIAGNOSIS — Z1239 Encounter for other screening for malignant neoplasm of breast: Secondary | ICD-10-CM

## 2017-05-26 NOTE — Progress Notes (Signed)
Patient: Heather Hester, Female    DOB: 07/20/1970, 47 y.o.   MRN: [id number] Visit Date: 05/26/2017  Today's Provider: Margaretann Loveless, PA-C   Chief Complaint  Patient presents with  . Annual Exam   Subjective:    Annual physical exam Heather Hester is a 47 y.o. female who presents today for health maintenance and complete physical. She feels well. She reports exercising. She reports she is sleeping well.   Last CPE:05/25/2016 Pap:11/28/2012 Normal, HPV-Negative Mammogram:04/25/16 U L Right-BI-RADS 2-Recommend follow-up in 1 yr. -----------------------------------------------------------------  Review of Systems  Constitutional: Negative.   HENT: Negative.  Tinnitus: only once a few weeks ago.   Eyes: Negative.   Respiratory: Negative.   Cardiovascular: Negative.   Gastrointestinal: Negative.   Endocrine: Negative.   Genitourinary: Negative.   Musculoskeletal: Negative.   Skin: Negative.   Allergic/Immunologic: Negative.   Neurological: Negative.   Hematological: Negative.   Psychiatric/Behavioral: Negative.     Social History      She  reports that she has never smoked. She has never used smokeless tobacco. She reports that she does not drink alcohol or use drugs.       Social History   Socioeconomic History  . Marital status: Single    Spouse name: Not on file  . Number of children: 0  . Years of education: HS  . Highest education level: Not on file  Occupational History  . Occupation: Air cabin crew    Comment: Full-Time  Social Needs  . Financial resource strain: Not on file  . Food insecurity:    Worry: Not on file    Inability: Not on file  . Transportation needs:    Medical: Not on file    Non-medical: Not on file  Tobacco Use  . Smoking status: Never Smoker  . Smokeless tobacco: Never Used  Substance and Sexual Activity  . Alcohol use: No    Alcohol/week: 0.0 oz  . Drug use: No  . Sexual activity: Not on  file  Lifestyle  . Physical activity:    Days per week: Not on file    Minutes per session: Not on file  . Stress: Not on file  Relationships  . Social connections:    Talks on phone: Not on file    Gets together: Not on file    Attends religious service: Not on file    Active member of club or organization: Not on file    Attends meetings of clubs or organizations: Not on file    Relationship status: Not on file  Other Topics Concern  . Not on file  Social History Narrative  . Not on file    History reviewed. No pertinent past medical history.   Patient Active Problem List   Diagnosis Date Noted  . Class 3 obesity due to excess calories without serious comorbidity with body mass index (BMI) of 45.0 to 49.9 in adult 05/25/2016  . Vitamin D deficiency 08/13/2014  . Body mass index (BMI) 45.0-49.9, adult (HCC) 06/26/2014  . Idiopathic peripheral neuropathy 06/26/2014  . Burning or prickling sensation 06/26/2014  . Borderline diabetes 06/26/2014    Past Surgical History:  Procedure Laterality Date  . BREAST BIOPSY Right 09/23/2007   core bx with clip  . CARPAL TUNNEL RELEASE Left 2010    Family History        Family Status  Relation Name Status  . Mother  Alive  . Father  Deceased  .  Brother  Alive  . Neg Hx  (Not Specified)        Her family history includes COPD in her mother; Depression in her mother; Hypertension in her brother; Kidney disease in her father; Sleep apnea in her brother. There is no history of Breast cancer.      No Known Allergies   Current Outpatient Medications:  .  CALCIUM CARBONATE PO, Take by mouth., Disp: , Rfl:  .  Cholecalciferol (VITAMIN D) 2000 UNITS CAPS, Take 1 capsule (2,000 Units total) by mouth daily. (Patient taking differently: Take 1 capsule by mouth at bedtime. ), Disp: 30 capsule, Rfl:  .  [employer] MILK THISTLE PO, Take by mouth., Disp: , Rfl:  .  Multiple Vitamin (MULTI-VITAMINS PO), Take by mouth., Disp: , Rfl:    Patient  Care Team: Margaretann Loveless, PA-C as PCP - General (Family Medicine)      Objective:   Vitals: BP 118/88 (BP Location: Left Arm, Patient Position: Sitting, Cuff Size: Normal)   Pulse 87   Temp 98.5 F (36.9 C) (Oral)   Resp 16   Ht 5\' 2"  (1.575 m)   Wt 258 lb 12.8 oz (117.4 kg)   LMP 05/12/2017   SpO2 98%   BMI 47.34 kg/m     Physical Exam  Constitutional: She is oriented to person, place, and time. She appears well-developed and well-nourished. No distress.  HENT:  Head: Normocephalic and atraumatic.  Right Ear: Hearing, tympanic membrane, external ear and ear canal normal.  Left Ear: Hearing, tympanic membrane, external ear and ear canal normal.  Nose: Nose normal.  Mouth/Throat: Uvula is midline, oropharynx is clear and moist and mucous membranes are normal. No oropharyngeal exudate.  Eyes: Pupils are equal, round, and reactive to light. Conjunctivae and EOM are normal. Right eye exhibits no discharge. Left eye exhibits no discharge. No scleral icterus.  Neck: Normal range of motion. Neck supple. No JVD present. Carotid bruit is not present. No tracheal deviation present. No thyromegaly present.  Cardiovascular: Normal rate, regular rhythm, normal heart sounds and intact distal pulses. Exam reveals no gallop and no friction rub.  No murmur heard. Pulmonary/Chest: Effort normal and breath sounds normal. No respiratory distress. She has no wheezes. She has no rales. She exhibits no tenderness. Right breast exhibits no inverted nipple, no mass, no nipple discharge, no skin change and no tenderness. Left breast exhibits no inverted nipple, no mass, no nipple discharge, no skin change and no tenderness. No breast swelling, tenderness, discharge or bleeding. Breasts are symmetrical.  Abdominal: Soft. Bowel sounds are normal. She exhibits no distension and no mass. There is no tenderness. There is no rebound and no guarding. Hernia confirmed negative in the right inguinal area and  confirmed negative in the left inguinal area.  Genitourinary: Rectum normal, vagina normal and uterus normal. No breast swelling, tenderness, discharge or bleeding. Pelvic exam was performed with patient supine. There is no rash, tenderness, lesion or injury on the right labia. There is no rash, tenderness, lesion or injury on the left labia. Cervix exhibits no motion tenderness, no discharge and no friability. Right adnexum displays no mass, no tenderness and no fullness. Left adnexum displays no mass, no tenderness and no fullness. No erythema, tenderness or bleeding in the vagina. No signs of injury around the vagina. No vaginal discharge found.  Musculoskeletal: Normal range of motion. She exhibits no edema or tenderness.  Lymphadenopathy:    She has no cervical adenopathy.  Right: No inguinal adenopathy present.       Left: No inguinal adenopathy present.  Neurological: She is alert and oriented to person, place, and time. She has normal reflexes. No cranial nerve deficit. Coordination normal.  Skin: Skin is warm and dry. No rash noted. She is not diaphoretic.  Psychiatric: She has a normal mood and affect. Her behavior is normal. Judgment and thought content normal.  Vitals reviewed.    Depression Screen PHQ 2/9 Scores 05/26/2017 05/25/2016 03/13/2015  PHQ - 2 Score 0 0 0  PHQ- 9 Score - 0 -      Assessment & Plan:     Routine Health Maintenance and Physical Exam  Exercise Activities and Dietary recommendations Goals    . diet     Going to due formal diet.     . Exercise 150 minutes per week (moderate activity)    . Exercise 150 minutes per week (moderate activity)       Immunization History  Administered Date(s) Administered  . Influenza,inj,Quad PF,6+ Mos 11/24/2016  . Tdap 11/28/2012    Health Maintenance  Topic Date Due  . PAP SMEAR  11/29/2015  . TETANUS/TDAP  11/29/2022  . INFLUENZA VACCINE  Completed  . HIV Screening  Completed     Discussed health  benefits of physical activity, and encouraged her to engage in regular exercise appropriate for her age and condition.    1. Annual physical exam Normal physical exam today. Will check labs as below and f/u pending lab results. If labs are stable and WNL she will not need to have these rechecked for one year at her next annual physical exam. She is to call the office in the meantime if she has any acute issue, questions or concerns. - CBC with Differential/Platelet - Comprehensive metabolic panel - TSH  2. Breast cancer screening Breast exam today was normal. There is family history of breast cancer. She does perform regular self breast exams. Mammogram was ordered as below. [facility] patient so she may schedule her mammogram at her convenience. - MM DIGITAL SCREENING BILATERAL; Future  3. Encounter for screening for cervical cancer  Pap collected today. Will send as below and f/u pending results. - Pap IG and HPV (high risk) DNA detection  4. History of cyst of breast - MM DIGITAL SCREENING BILATERAL; Future  5. Borderline diabetes Diet controlled. Will check labs as below and f/u pending results. - Hemoglobin A1c  6. Encounter for lipid screening for cardiovascular disease Counseled patient on healthy lifestyle modifications including dieting and exercise.  - Lipid panel  7. Body mass index (BMI) 45.0-49.9, adult (HCC) Counseled patient on healthy lifestyle modifications including dieting and exercise.       Margaretann LovelessJennifer M Liberty Stead, PA-C  [facility]

## 2017-05-26 NOTE — Patient Instructions (Signed)

## 2017-05-27 LAB — COMPREHENSIVE METABOLIC PANEL
ALT: 16 IU/L (ref 0–32)
AST: 10 IU/L (ref 0–40)
Albumin/Globulin Ratio: 1.5 (ref 1.2–2.2)
Albumin: 4 g/dL (ref 3.5–5.5)
Alkaline Phosphatase: 70 IU/L (ref 39–117)
BUN/Creatinine Ratio: 15 (ref 9–23)
BUN: 13 mg/dL (ref 6–24)
Bilirubin Total: 0.2 mg/dL (ref 0.0–1.2)
CO2: 22 mmol/L (ref 20–29)
Calcium: 8.9 mg/dL (ref 8.7–10.2)
Chloride: 102 mmol/L (ref 96–106)
Creatinine, Ser: 0.85 mg/dL (ref 0.57–1.00)
GFR calc Af Amer: 94 mL/min/{1.73_m2} (ref >59)
GFR calc non Af Amer: 82 mL/min/{1.73_m2} (ref >59)
Globulin, Total: 2.6 g/dL (ref 1.5–4.5)
Glucose: 93 mg/dL (ref 65–99)
Potassium: 3.8 mmol/L (ref 3.5–5.2)
Sodium: 139 mmol/L (ref 134–144)
Total Protein: 6.6 g/dL (ref 6.0–8.5)

## 2017-05-27 LAB — CBC WITH DIFFERENTIAL/PLATELET
Basophils Absolute: 0 10*3/uL (ref 0.0–0.2)
Basos: 0 %
EOS (ABSOLUTE): 0.1 10*3/uL (ref 0.0–0.4)
Eos: 1 %
Hematocrit: 40.6 % (ref 34.0–46.6)
Hemoglobin: 13 g/dL (ref 11.1–15.9)
Immature Grans (Abs): 0 10*3/uL (ref 0.0–0.1)
Immature Granulocytes: 0 %
Lymphocytes Absolute: 2.1 10*3/uL (ref 0.7–3.1)
Lymphs: 24 %
MCH: 28.1 pg (ref 26.6–33.0)
MCHC: 32 g/dL (ref 31.5–35.7)
MCV: 88 fL (ref 79–97)
Monocytes Absolute: 0.5 10*3/uL (ref 0.1–0.9)
Monocytes: 5 %
Neutrophils Absolute: 6.2 10*3/uL (ref 1.4–7.0)
Neutrophils: 70 %
Platelets: 262 10*3/uL (ref 150–379)
RBC: 4.63 x10E6/uL (ref 3.77–5.28)
RDW: 14.6 % (ref 12.3–15.4)
WBC: 9 10*3/uL (ref 3.4–10.8)

## 2017-05-27 LAB — TSH: TSH: 1.71 u[IU]/mL (ref 0.450–4.500)

## 2017-05-27 LAB — LIPID PANEL
Chol/HDL Ratio: 3.5 ratio (ref 0.0–4.4)
Cholesterol, Total: 197 mg/dL (ref 100–199)
HDL: 57 mg/dL (ref >39)
LDL Calculated: 113 mg/dL — ABNORMAL HIGH (ref 0–99)
Triglycerides: 133 mg/dL (ref 0–149)
VLDL Cholesterol Cal: 27 mg/dL (ref 5–40)

## 2017-05-27 LAB — HEMOGLOBIN A1C
Est. average glucose Bld gHb Est-mCnc: 111 mg/dL
Hgb A1c MFr Bld: 5.5 % (ref 4.8–5.6)

## 2017-05-30 LAB — PAP IG AND HPV HIGH-RISK
HPV, high-risk: NEGATIVE
PAP Smear Comment: 0

## 2017-05-31 ENCOUNTER — Telehealth: Payer: Self-pay

## 2017-05-31 NOTE — Telephone Encounter (Signed)
Patient advised as below.  

## 2017-05-31 NOTE — Telephone Encounter (Signed)
-----   Message from Margaretann LovelessJennifer M [place]ette, [place]PA-C sent at 05/30/2017  6:17 PM EDT ----- Pap is normal, HPV negative.  Will repeat in 3-5 years.

## 2017-06-14 ENCOUNTER — Telehealth: Payer: Self-pay | Admitting: Physician Assistant

## 2017-06-14 DIAGNOSIS — N6001 Solitary cyst of right breast: Secondary | ICD-10-CM

## 2017-06-14 DIAGNOSIS — Z1239 Encounter for other screening for malignant neoplasm of breast: Secondary | ICD-10-CM

## 2017-06-14 NOTE — Telephone Encounter (Signed)
I dont think her insurance would cover a diagnostic mammogram, per the last radiologist note it said to go to regular screenings again. She can get the 3D again, however.

## 2017-06-14 NOTE — Telephone Encounter (Signed)
Ok to order 

## 2017-06-14 NOTE — Telephone Encounter (Signed)
LMTCB

## 2017-06-14 NOTE — Telephone Encounter (Signed)
Pt needs to schedule a mammogram but it has to be a diagnostic mammogram so she will need for us to schd or put an order in so she can schd.  Her call back is [phone]  Thanks teri

## 2017-06-15 NOTE — Telephone Encounter (Signed)
I can order it to see if it will be covered. They will let us know if not. From my experience once a reading says return to routine screenings they no longer cover a diagnostic unless something new is found.

## 2017-06-15 NOTE — Telephone Encounter (Signed)
Patient reports she was told she has a cyst on her left breast. Would that require a diagnostic mammogram?

## 2017-06-15 NOTE — Telephone Encounter (Signed)
Patient advised as below.  

## 2017-07-19 ENCOUNTER — Other Ambulatory Visit: Payer: Self-pay | Admitting: Physician Assistant

## 2017-07-19 ENCOUNTER — Telehealth: Payer: Self-pay | Admitting: Physician Assistant

## 2017-07-19 DIAGNOSIS — N6001 Solitary cyst of right breast: Secondary | ICD-10-CM

## 2017-07-19 DIAGNOSIS — Z1239 Encounter for other screening for malignant neoplasm of breast: Secondary | ICD-10-CM

## 2017-07-19 NOTE — Telephone Encounter (Signed)
Called saying she needs an order sent to [facility] for bilat [place] of her breast.  She said they may be reaching out ot you for it also.  She had her CPE in March and it is time to do the US's also.  Pt's call back is [phone] and leave a message if she does not pick up  Her work number is [phone]. Ask specifically for [facility]

## 2017-07-19 NOTE — Telephone Encounter (Signed)
Already signed.

## 2017-08-30 ENCOUNTER — Ambulatory Visit
Admission: RE | Admit: 2017-08-30 | Discharge: 2017-08-30 | Disposition: A | Discharge status: Home / Self Care | Payer: BLUE CROSS/BLUE SHIELD | Source: Ambulatory Visit | Attending: Physician Assistant | Admitting: Physician Assistant

## 2017-08-30 DIAGNOSIS — N6001 Solitary cyst of right breast: Secondary | ICD-10-CM | POA: Diagnosis present

## 2017-08-30 DIAGNOSIS — Z1239 Encounter for other screening for malignant neoplasm of breast: Secondary | ICD-10-CM

## 2017-08-30 DIAGNOSIS — Z1231 Encounter for screening mammogram for malignant neoplasm of breast: Secondary | ICD-10-CM | POA: Diagnosis present

## 2017-08-31 ENCOUNTER — Other Ambulatory Visit: Payer: Self-pay | Admitting: Physician Assistant

## 2017-08-31 DIAGNOSIS — N6310 Unspecified lump in the right breast, unspecified quadrant: Secondary | ICD-10-CM

## 2017-08-31 DIAGNOSIS — R928 Other abnormal and inconclusive findings on diagnostic imaging of breast: Secondary | ICD-10-CM

## 2017-09-05 ENCOUNTER — Ambulatory Visit
Admission: RE | Admit: 2017-09-05 | Discharge: 2017-09-05 | Disposition: A | Discharge status: Home / Self Care | Payer: BLUE CROSS/BLUE SHIELD | Source: Ambulatory Visit | Attending: Physician Assistant | Admitting: Physician Assistant

## 2017-09-05 DIAGNOSIS — N6310 Unspecified lump in the right breast, unspecified quadrant: Secondary | ICD-10-CM

## 2017-09-05 DIAGNOSIS — R928 Other abnormal and inconclusive findings on diagnostic imaging of breast: Secondary | ICD-10-CM

## 2017-09-05 HISTORY — PX: BREAST BIOPSY: SHX20

## 2017-09-06 LAB — SURGICAL PATHOLOGY

## 2018-08-02 ENCOUNTER — Encounter: Payer: Self-pay | Admitting: Physician Assistant

## 2018-08-02 ENCOUNTER — Ambulatory Visit: Payer: BLUE CROSS/BLUE SHIELD | Admitting: Physician Assistant

## 2018-08-02 ENCOUNTER — Other Ambulatory Visit: Payer: Self-pay

## 2018-08-02 VITALS — BP 118/80 | HR 92 | Temp 98.0°F | Resp 16 | Wt 266.0 lb

## 2018-08-02 DIAGNOSIS — R7303 Prediabetes: Secondary | ICD-10-CM | POA: Diagnosis not present

## 2018-08-02 DIAGNOSIS — R300 Dysuria: Secondary | ICD-10-CM | POA: Diagnosis not present

## 2018-08-02 DIAGNOSIS — Z6841 Body Mass Index (BMI) 40.0 and over, adult: Secondary | ICD-10-CM

## 2018-08-02 LAB — POCT URINALYSIS DIPSTICK
Appearance: ABNORMAL
Bilirubin, UA: NEGATIVE
Blood, UA: NEGATIVE
Glucose, UA: NEGATIVE
Ketones, UA: NEGATIVE
Nitrite, UA: 0.2
Odor: NORMAL
Protein, UA: NEGATIVE
Spec Grav, UA: 1.01 (ref 1.010–1.025)
Urobilinogen, UA: 0.2 E.U./dL
pH, UA: 7 (ref 5.0–8.0)

## 2018-08-02 LAB — POCT GLYCOSYLATED HEMOGLOBIN (HGB A1C)
EOS (ABSOLUTE): 108
Hemoglobin A1C: 5.4 % (ref 4.0–5.6)

## 2018-08-02 NOTE — Progress Notes (Signed)
Patient: Heather Hester Female    DOB: Jun 30, 1970   48 y.o.   MRN: [id number] Visit Date: 08/02/2018  Today's Provider: Margaretann Loveless, PA-C   No chief complaint on file.  Subjective:     HPI   Borderline diabetes From 05/26/2017-Diet controlled. labs checked, no changes. A1c 5.5. She has been using more vitamins and supplements. She has been trying to lose weight as well. Working on healthy dieting habits and working out.   She is having some UTI symptoms but would like to try natural methods to clear.    No Known Allergies   Current Outpatient Medications:  .  CALCIUM CARBONATE PO, Take by mouth., Disp: , Rfl:  .  Cholecalciferol (VITAMIN D) 2000 UNITS CAPS, Take 1 capsule (2,000 Units total) by mouth daily. (Patient taking differently: Take 1 capsule by mouth at bedtime. ), Disp: 30 capsule, Rfl:  .  Coenzyme Q10 (COQ-10 PO), Take by mouth., Disp: , Rfl:  .  [employer] MILK THISTLE PO, Take by mouth., Disp: , Rfl:  .  Cyanocobalamin (VITAMIN B-12 PO), Take by mouth., Disp: , Rfl:  .  D-Mannose 500 MG CAPS, Take 500 mg by mouth 3 (three) times daily as needed., Disp: , Rfl:  .  MAGNESIUM PO, Take by mouth., Disp: , Rfl:  .  Multiple Vitamin (MULTI-VITAMINS PO), Take by mouth., Disp: , Rfl:   Review of Systems  Constitutional: Negative for appetite change, chills, fatigue and fever.  Respiratory: Negative for chest tightness and shortness of breath.   Cardiovascular: Negative for chest pain and palpitations.  Gastrointestinal: Negative for abdominal pain, nausea and vomiting.  Genitourinary: Positive for dysuria and frequency. Negative for difficulty urinating, flank pain, pelvic pain and urgency.  Neurological: Negative for dizziness and weakness.    Social History   Tobacco Use  . Smoking status: Never Smoker  . Smokeless tobacco: Never Used  Substance Use Topics  . Alcohol use: No    Alcohol/week: 0.0 standard drinks      Objective:   BP 118/80 (BP  Location: Right Arm, Patient Position: Sitting, Cuff Size: Large)   Pulse 92   Temp 98 F (36.7 C) (Oral)   Resp 16   Wt 266 lb (120.7 kg)   SpO2 96%   BMI 48.65 kg/m  Vitals:   08/02/18 0848  BP: 118/80  Pulse: 92  Resp: 16  Temp: 98 F (36.7 C)  TempSrc: Oral  SpO2: 96%  Weight: 266 lb (120.7 kg)     Physical Exam Vitals signs reviewed.  Constitutional:      General: She is not in acute distress.    Appearance: Normal appearance. She is well-developed. She is obese. She is not ill-appearing or diaphoretic.  Neck:     Musculoskeletal: Normal range of motion and neck supple.     Thyroid: No thyromegaly.     Vascular: No JVD.     Trachea: No tracheal deviation.  Cardiovascular:     Rate and Rhythm: Normal rate and regular rhythm.     Heart sounds: Normal heart sounds. No murmur. No friction rub. No gallop.   Pulmonary:     Effort: Pulmonary effort is normal. No respiratory distress.     Breath sounds: Normal breath sounds. No wheezing or rales.  Lymphadenopathy:     Cervical: No cervical adenopathy.  Neurological:     Mental Status: She is alert.       Assessment & Plan  1. Borderline diabetes A1c stable at 5.4. Continue healthy lifestyle modifications.  - POCT glycosylated hemoglobin (Hb A1C)  2. BMI 45.0-49.9, adult [facility](HCC) Counseled patient on healthy lifestyle modifications including dieting and exercise.   3. Dysuria UA positive. Discussed pushing fluids and trying AZO tabs OTC. I will send for culture and will f/u pending results.  - CULTURE, URINE COMPREHENSIVE - POCT urinalysis dipstick     Margaretann LovelessJennifer M Jocsan Mcginley, PA-C  [facility]

## 2018-08-04 LAB — CULTURE, URINE COMPREHENSIVE

## 2018-08-14 ENCOUNTER — Telehealth: Payer: Self-pay | Admitting: Physician Assistant

## 2018-08-14 NOTE — Telephone Encounter (Signed)
Patient scheduled for Mychart virtual visit.

## 2018-08-14 NOTE — Telephone Encounter (Signed)
pt Called asking for Sharyn Lull to call her back about the Covid testing and doing  Virtual visit  CB#  [phone]  teri.

## 2018-08-15 ENCOUNTER — Ambulatory Visit: Payer: BC Managed Care – PPO | Admitting: Physician Assistant

## 2018-08-15 ENCOUNTER — Telehealth (INDEPENDENT_AMBULATORY_CARE_PROVIDER_SITE_OTHER): Payer: BC Managed Care – PPO | Admitting: Physician Assistant

## 2018-08-15 ENCOUNTER — Encounter: Payer: Self-pay | Admitting: Physician Assistant

## 2018-08-15 DIAGNOSIS — Z7189 Other specified counseling: Secondary | ICD-10-CM | POA: Diagnosis not present

## 2018-08-15 NOTE — Progress Notes (Signed)
Virtual Visit via Video Note  I connected with Heather Hester on 08/15/18 at  2:40 PM EDT by a video enabled telemedicine application and verified that I am speaking with the correct person using two identifiers.  Location: Patient: Work Provider: [facility]   I discussed the limitations of evaluation and management by telemedicine and the availability of in person appointments. The patient expressed understanding and agreed to proceed.   Patient: Heather Hester Female    DOB: January 30, 1971   48 y.o.   MRN: [id number] Visit Date: 08/15/2018  Today's Provider: Margaretann LovelessJennifer M Burnette, PA-C   No chief complaint on file.  Subjective:   HPI   Heather GuttingCathy Hester is a 48 yr old female that presents today via video visit through Mychart. She reports that a coworker in her office, but not someone she has direct contact with, tested positive for Covid-19. He had no symptoms and is still asymptomatic. The health department has said he most likely has not spread the disease since he has been asymptomatic and that all employees could return to work with normal cleaning as they were doing. She is not currently having any symptoms. She just has questions about how to proceed and what to watch for.   No Known Allergies   Current Outpatient Medications:  .  CALCIUM CARBONATE PO, Take by mouth., Disp: , Rfl:  .  Cholecalciferol (VITAMIN D) 2000 UNITS CAPS, Take 1 capsule (2,000 Units total) by mouth daily. (Patient taking differently: Take 1 capsule by mouth at bedtime. ), Disp: 30 capsule, Rfl:  .  Coenzyme Q10 (COQ-10 PO), Take by mouth., Disp: , Rfl:  .  [employer] MILK THISTLE PO, Take by mouth., Disp: , Rfl:  .  Cyanocobalamin (VITAMIN B-12 PO), Take by mouth., Disp: , Rfl:  .  D-Mannose 500 MG CAPS, Take 500 mg by mouth 3 (three) times daily as needed., Disp: , Rfl:  .  MAGNESIUM PO, Take by mouth., Disp: , Rfl:  .  Multiple Vitamin (MULTI-VITAMINS PO), Take by mouth., Disp: , Rfl:   Review of Systems   Constitutional: Negative.   HENT: Negative.   Respiratory: Negative.   Cardiovascular: Negative.   Neurological: Negative.     Social History   Tobacco Use  . Smoking status: Never Smoker  . Smokeless tobacco: Never Used  Substance Use Topics  . Alcohol use: No    Alcohol/week: 0.0 standard drinks      Objective:   There were no vitals taken for this visit. There were no vitals filed for this visit.   Physical Exam Vitals signs reviewed.  Constitutional:      General: She is not in acute distress.    Appearance: Normal appearance. She is well-developed. She is not ill-appearing.  HENT:     Head: Normocephalic and atraumatic.  Neck:     Musculoskeletal: Normal range of motion and neck supple.  Pulmonary:     Effort: Pulmonary effort is normal. No respiratory distress.  Neurological:     Mental Status: She is alert.  Psychiatric:        Mood and Affect: Mood normal.        Behavior: Behavior normal.        Thought Content: Thought content normal.        Judgment: Judgment normal.         Assessment & Plan    1. Educated About Covid-19 Virus Infection Had discussion about symptoms of Covid-19, good hand hygiene, wearing a  mask, having others around her wear a mask, and cleaning surfaces frequently in her office. She is to call the office if any symptoms develop. All questions were answered to the best of my ability.   I discussed the assessment and treatment plan with the patient. The patient was provided an opportunity to ask questions and all were answered. The patient agreed with the plan and demonstrated an understanding of the instructions.   The patient was advised to call back or seek an in-person evaluation if the symptoms worsen or if the condition fails to improve as anticipated.  I provided 8 minutes of non-face-to-face time during this encounter.    Mar Daring, PA-C  [facility]

## 2018-08-15 NOTE — Patient Instructions (Signed)
This information is directly available on the CDC website: https://www.cdc.gov/coronavirus/2019-ncov/if-you-are-sick/steps-when-sick.html    Source:CDC Reference to specific commercial products, manufacturers, companies, or trademarks does not constitute its endorsement or recommendation by the U.S. Government, Department of Health and Human Services, or Centers for Disease Control and Prevention.  

## 2018-08-21 ENCOUNTER — Encounter: Payer: Self-pay | Admitting: Physician Assistant

## 2018-08-21 DIAGNOSIS — L237 Allergic contact dermatitis due to plants, except food: Secondary | ICD-10-CM

## 2018-08-21 MED ORDER — PREDNISONE 10 MG (21) PO TBPK: ORAL_TABLET | ORAL | 0 refills | Status: DC

## 2019-01-04 ENCOUNTER — Other Ambulatory Visit: Payer: Self-pay

## 2019-01-04 DIAGNOSIS — Z20822 Contact with and (suspected) exposure to covid-19: Secondary | ICD-10-CM

## 2019-01-06 LAB — NOVEL CORONAVIRUS, NAA: SARS-CoV-2, NAA: NOT DETECTED

## 2019-09-14 ENCOUNTER — Encounter: Payer: Self-pay | Admitting: Physician Assistant

## 2019-12-06 ENCOUNTER — Other Ambulatory Visit: Payer: Self-pay

## 2019-12-06 ENCOUNTER — Ambulatory Visit (INDEPENDENT_AMBULATORY_CARE_PROVIDER_SITE_OTHER): Payer: BC Managed Care – PPO | Admitting: Physician Assistant

## 2019-12-06 ENCOUNTER — Encounter: Payer: Self-pay | Admitting: Physician Assistant

## 2019-12-06 VITALS — BP 148/85 | HR 100 | Temp 98.3°F | Resp 16 | Wt 286.9 lb

## 2019-12-06 DIAGNOSIS — Z0000 Encounter for general adult medical examination without abnormal findings: Secondary | ICD-10-CM

## 2019-12-06 DIAGNOSIS — Z1239 Encounter for other screening for malignant neoplasm of breast: Secondary | ICD-10-CM | POA: Diagnosis not present

## 2019-12-06 DIAGNOSIS — Z23 Encounter for immunization: Secondary | ICD-10-CM

## 2019-12-06 DIAGNOSIS — Z6841 Body Mass Index (BMI) 40.0 and over, adult: Secondary | ICD-10-CM

## 2019-12-06 DIAGNOSIS — Z1211 Encounter for screening for malignant neoplasm of colon: Secondary | ICD-10-CM

## 2019-12-06 NOTE — Progress Notes (Signed)
Complete physical exam   Patient: Heather Hester   DOB: June 25, 1970   49 y.o. Female  MRN: [id number] Visit Date: 12/06/2019  Today's healthcare provider: Margaretann Loveless, PA-C   Chief Complaint  Patient presents with  . Annual Exam   Subjective    Heather Hester is a 49 y.o. female who presents today for a complete physical exam.  She reports consuming a general diet. The patient does not participate in regular exercise at present. She generally feels well. She reports sleeping well. She does not have additional problems to discuss today.  HPI  05/30/17-Pap normal, HPV Negative. Will repeat in 3-5 years.  History reviewed. No pertinent past medical history. Past Surgical History:  Procedure Laterality Date  . BREAST BIOPSY Right 09/23/2007   core bx with clip  . CARPAL TUNNEL RELEASE Left 2010   Social History   Socioeconomic History  . Marital status: Single    Spouse name: Not on file  . Number of children: 0  . Years of education: HS  . Highest education level: Not on file  Occupational History  . Occupation: Air cabin crew    Comment: Full-Time  Tobacco Use  . Smoking status: Never Smoker  . Smokeless tobacco: Never Used  Substance and Sexual Activity  . Alcohol use: No    Alcohol/week: 0.0 standard drinks  . Drug use: No  . Sexual activity: Not on file  Other Topics Concern  . Not on file  Social History Narrative  . Not on file   Social Determinants of Health   Financial Resource Strain:   . Difficulty of Paying Living Expenses: Not on file  Food Insecurity:   . Worried About Programme researcher, broadcasting/film/video in the Last Year: Not on file  . Ran Out of Food in the Last Year: Not on file  Transportation Needs:   . Lack of Transportation (Medical): Not on file  . Lack of Transportation (Non-Medical): Not on file  Physical Activity:   . Days of Exercise per Week: Not on file  . Minutes of Exercise per Session: Not on file  Stress:    . Feeling of Stress : Not on file  Social Connections:   . Frequency of Communication with Friends and Family: Not on file  . Frequency of Social Gatherings with Friends and Family: Not on file  . Attends Religious Services: Not on file  . Active Member of Clubs or Organizations: Not on file  . Attends Banker Meetings: Not on file  . Marital Status: Not on file  Intimate Partner Violence:   . Fear of Current or Ex-Partner: Not on file  . Emotionally Abused: Not on file  . Physically Abused: Not on file  . Sexually Abused: Not on file   Family Status  Relation Name Status  . Mother  Alive  . Father  Deceased  . Brother  Alive  . Neg Hx  (Not Specified)   Family History  Problem Relation Age of Onset  . COPD Mother   . Depression Mother   . Kidney disease Father   . Hypertension Brother   . Sleep apnea Brother   . Breast cancer Neg Hx    No Known Allergies  Patient Care Team: Reine Just as PCP - General (Family Medicine)   Medications: Outpatient Medications Prior to Visit  Medication Sig  . CALCIUM CARBONATE PO Take by mouth.  . Cholecalciferol (VITAMIN D) 2000 UNITS  CAPS Take 1 capsule (2,000 Units total) by mouth daily. (Patient taking differently: Take 1 capsule by mouth at bedtime. )  . Coenzyme Q10 (COQ-10 PO) Take by mouth.  . [employer]S MILK THISTLE PO Take by mouth.  . Cyanocobalamin (VITAMIN B-12 PO) Take by mouth.  . D-Mannose 500 MG CAPS Take 500 mg by mouth 3 (three) times daily as needed.  Marland Kitchen. MAGNESIUM PO Take by mouth.  . Multiple Vitamin (MULTI-VITAMINS PO) Take by mouth.  . [DISCONTINUED] predniSONE (STERAPRED UNI-PAK 21 TAB) 10 MG (21) TBPK tablet 6 day taper; take as directed on package instructions   No facility-administered medications prior to visit.    Review of Systems  Constitutional: Negative.   HENT: Negative.   Eyes: Negative.   Respiratory: Negative.   Cardiovascular: Negative.   Gastrointestinal: Negative.    Endocrine: Negative.   Genitourinary: Negative.   Musculoskeletal: Negative.   Skin: Negative.   Allergic/Immunologic: Negative.   Neurological: Negative.   Hematological: Negative.   Psychiatric/Behavioral: Negative.     Last CBC Lab Results  Component Value Date   WBC 9.2 12/06/2019   HGB 13.3 12/06/2019   HCT 40.9 12/06/2019   MCV 83 12/06/2019   MCH 27.0 12/06/2019   RDW 13.5 12/06/2019   PLT 255 12/06/2019   Last metabolic panel Lab Results  Component Value Date   GLUCOSE 79 12/06/2019   NA 139 12/06/2019   K 4.0 12/06/2019   CL 101 12/06/2019   CO2 21 12/06/2019   BUN 10 12/06/2019   CREATININE 0.76 12/06/2019   GFRNONAA 92 12/06/2019   GFRAA 106 12/06/2019   CALCIUM 9.0 12/06/2019   PROT 6.7 12/06/2019   ALBUMIN 4.3 12/06/2019   LABGLOB 2.4 12/06/2019   AGRATIO 1.8 12/06/2019   BILITOT 0.3 12/06/2019   ALKPHOS 90 12/06/2019   AST 13 12/06/2019   ALT 20 12/06/2019   ANIONGAP 6 02/25/2015      Objective    BP (!) 148/85 (BP Location: Left Arm, Patient Position: Sitting, Cuff Size: Large)   Pulse 100   Temp 98.3 F (36.8 C) (Oral)   Resp 16   Wt 286 lb 14.4 oz (130.1 kg)   BMI 52.47 kg/m  BP Readings from Last 3 Encounters:  12/06/19 (!) 148/85  08/02/18 118/80  05/26/17 118/88   Wt Readings from Last 3 Encounters:  12/06/19 286 lb 14.4 oz (130.1 kg)  08/02/18 266 lb (120.7 kg)  05/26/17 258 lb 12.8 oz (117.4 kg)      Physical Exam Vitals reviewed.  Constitutional:      General: She is not in acute distress.    Appearance: Normal appearance. She is well-developed. She is obese. She is not ill-appearing or diaphoretic.  HENT:     Head: Normocephalic and atraumatic.     Right Ear: Tympanic membrane, ear canal and external ear normal.     Left Ear: Tympanic membrane, ear canal and external ear normal.     Nose: Nose normal.     Mouth/Throat:     Mouth: Mucous membranes are moist.     Pharynx: Oropharynx is clear. No oropharyngeal  exudate or posterior oropharyngeal erythema.  Eyes:     General: No scleral icterus.       Right eye: No discharge.        Left eye: No discharge.     Extraocular Movements: Extraocular movements intact.     Conjunctiva/sclera: Conjunctivae normal.     Pupils: Pupils are equal, round, and reactive to  light.  Neck:     Thyroid: No thyromegaly.     Vascular: No carotid bruit or JVD.     Trachea: No tracheal deviation.  Cardiovascular:     Rate and Rhythm: Normal rate and regular rhythm.     Pulses: Normal pulses.     Heart sounds: Normal heart sounds. No murmur heard.  No friction rub. No gallop.   Pulmonary:     Effort: Pulmonary effort is normal. No respiratory distress.     Breath sounds: Normal breath sounds. No wheezing or rales.  Chest:     Chest wall: No tenderness.     Breasts:        Right: Normal.        Left: Normal.  Abdominal:     General: Abdomen is flat. Bowel sounds are normal. There is no distension.     Palpations: Abdomen is soft. There is no mass.     Tenderness: There is no abdominal tenderness. There is no guarding or rebound.  Musculoskeletal:        General: No tenderness. Normal range of motion.     Cervical back: Normal range of motion and neck supple. No tenderness.     Right lower leg: No edema.     Left lower leg: No edema.  Lymphadenopathy:     Cervical: No cervical adenopathy.     Upper Body:     Right upper body: No supraclavicular, axillary or pectoral adenopathy.     Left upper body: No supraclavicular, axillary or pectoral adenopathy.  Skin:    General: Skin is warm and dry.     Capillary Refill: Capillary refill takes less than 2 seconds.     Findings: No rash.  Neurological:     General: No focal deficit present.     Mental Status: She is alert and oriented to person, place, and time. Mental status is at baseline.  Psychiatric:        Attention and Perception: Attention and perception normal.        Mood and Affect: Mood and affect  normal. Mood is not depressed.        Speech: Speech normal.        Behavior: Behavior normal. Behavior is cooperative.        Thought Content: Thought content normal.        Cognition and Memory: Cognition and memory normal.        Judgment: Judgment normal.       Last depression screening scores PHQ 2/9 Scores 12/06/2019 08/02/2018 05/26/2017  PHQ - 2 Score 1 0 0  PHQ- 9 Score 2 0 -   Last fall risk screening Fall Risk  12/06/2019  Falls in the past year? 0  Number falls in past yr: 0  Injury with Fall? 0  Risk for fall due to : No Fall Risks  Follow up Falls evaluation completed   Last Audit-C alcohol use screening Alcohol Use Disorder Test (AUDIT) 12/06/2019  1. How often do you have a drink containing alcohol? 1  2. How many drinks containing alcohol do you have on a typical day when you are drinking? 0  3. How often do you have six or more drinks on one occasion? 0  AUDIT-C Score 1  Alcohol Brief Interventions/Follow-up AUDIT Score <7 follow-up not indicated   A score of 3 or more in women, and 4 or more in men indicates increased risk for alcohol abuse, EXCEPT if all of the points are  from question 1   Results for orders placed or performed in visit on 12/06/19  CBC with Differential/Platelet  Result Value Ref Range   WBC 9.2 3.4 - 10.8 x10E3/uL   RBC 4.93 3.77 - 5.28 x10E6/uL   Hemoglobin 13.3 11.1 - 15.9 g/dL   Hematocrit 16.1 09.6 - 46.6 %   MCV 83 79 - 97 fL   MCH 27.0 26.6 - 33.0 pg   MCHC 32.5 31 - 35 g/dL   RDW 04.5 40.9 - 81.1 %   Platelets 255 150 - 450 x10E3/uL   Neutrophils 73 Not Estab. %   Lymphs 20 Not Estab. %   Monocytes 6 Not Estab. %   Eos 1 Not Estab. %   Basos 0 Not Estab. %   Neutrophils Absolute 6.6 1 - 7 x10E3/uL   Lymphocytes Absolute 1.8 0 - 3 x10E3/uL   Monocytes Absolute 0.6 0 - 0 x10E3/uL   EOS (ABSOLUTE) 0.1 0.0 - 0.4 x10E3/uL   Basophils Absolute 0.0 0 - 0 x10E3/uL   Immature Granulocytes 0 Not Estab. %   Immature Grans (Abs) 0.0  0.0 - 0.1 x10E3/uL  Comprehensive metabolic panel  Result Value Ref Range   Glucose 79 65 - 99 mg/dL   BUN 10 6 - 24 mg/dL   Creatinine, Ser 9.14 0.57 - 1.00 mg/dL   GFR calc non Af Amer 92 >59 mL/min/1.73   GFR calc Af Amer 106 >59 mL/min/1.73   BUN/Creatinine Ratio 13 9 - 23   Sodium 139 134 - 144 mmol/L   Potassium 4.0 3.5 - 5.2 mmol/L   Chloride 101 96 - 106 mmol/L   CO2 21 20 - 29 mmol/L   Calcium 9.0 8.7 - 10.2 mg/dL   Total Protein 6.7 6.0 - 8.5 g/dL   Albumin 4.3 3.8 - 4.8 g/dL   Globulin, Total 2.4 1.5 - 4.5 g/dL   Albumin/Globulin Ratio 1.8 1.2 - 2.2   Bilirubin Total 0.3 0.0 - 1.2 mg/dL   Alkaline Phosphatase 90 44 - 121 IU/L   AST 13 0 - 40 IU/L   ALT 20 0 - 32 IU/L  Hemoglobin A1c  Result Value Ref Range   Hgb A1c MFr Bld 5.8 (H) 4.8 - 5.6 %   Est. average glucose Bld gHb Est-mCnc 120 mg/dL  Lipid panel  Result Value Ref Range   Cholesterol, Total 190 100 - 199 mg/dL   Triglycerides 782 0 - 149 mg/dL   HDL 55 >95 mg/dL   VLDL Cholesterol Cal 24 5 - 40 mg/dL   LDL Chol Calc (NIH) 621 (H) 0 - 99 mg/dL   Chol/HDL Ratio 3.5 0.0 - 4.4 ratio  TSH  Result Value Ref Range   TSH 2.100 0.450 - 4.500 uIU/mL    Assessment & Plan    Routine Health Maintenance and Physical Exam  Exercise Activities and Dietary recommendations Goals    . diet     Going to due formal diet.     . Exercise 150 minutes per week (moderate activity)    . Exercise 150 minutes per week (moderate activity)       Immunization History  Administered Date(s) Administered  . Influenza,inj,Quad PF,6+ Mos 11/28/2012, 02/06/2014, 11/24/2016, 12/06/2019  . Tdap 11/28/2012    Health Maintenance  Topic Date Due  . Hepatitis C Screening  Never done  . COVID-19 Vaccine (1) Never done  . COLONOSCOPY  Never done  . MAMMOGRAM  08/31/2019  . PAP SMEAR-Modifier  05/26/2020  . TETANUS/TDAP  11/29/2022  . INFLUENZA VACCINE  Completed  . HIV Screening  Completed    Discussed health benefits of  physical activity, and encouraged her to engage in regular exercise appropriate for her age and condition.  1. Annual physical exam Normal physical exam today. Will check labs as below and f/u pending lab results. If labs are stable and WNL she will not need to have these rechecked for one year at her next annual physical exam. She is to call the office in the meantime if she has any acute issue, questions or concerns. - CBC with Differential/Platelet - Comprehensive metabolic panel - Hemoglobin A1c - Lipid panel - TSH  2. Encounter for breast cancer screening using non-mammogram modality Breast exam today was normal. There is no family history of breast cancer. She does perform regular self breast exams. Mammogram was ordered as below. Information for [facility] clinic was given to patient so she may schedule her mammogram at her convenience. - MM DIAG BREAST TOMO BILATERAL; Future  3. Colon cancer screening No previous colonoscopy. No known family history of colon cancer. Cologuard ordered. - Cologuard  4. Class 3 severe obesity due to excess calories with serious comorbidity and body mass index (BMI) of 50.0 to 59.9 in adult [facility]) Counseled patient on healthy lifestyle modifications including dieting and exercise.  Will check labs as below and f/u pending results. - CBC with Differential/Platelet - Comprehensive metabolic panel - Hemoglobin A1c - Lipid panel - TSH  5. Need for influenza vaccination Flu vaccine given today without complication. Patient sat upright for 15 minutes to check for adverse reaction before being released. - Flu Vaccine QUAD 36+ mos IM   Return in about 1 year (around 12/05/2020).     Delmer Islam, PA-C, have reviewed all documentation for this visit. The documentation on 12/12/19 for the exam, diagnosis, procedures, and orders are all accurate and complete.   Reine Just  [facility] [place] [facility] [phone]  (phone) [phone] (fax)  [facility]

## 2019-12-06 NOTE — Patient Instructions (Signed)
[facility] [address] [place],  [place]  [address] Main: [phone]  Health Maintenance, Female Adopting a healthy lifestyle and getting preventive care are important in promoting health and wellness. Ask your health care provider about:  The right schedule for you to have regular tests and exams.  Things you can do on your own to prevent diseases and keep yourself healthy. What should I know about diet, weight, and exercise? Eat a healthy diet   Eat a diet that includes plenty of vegetables, fruits, low-fat dairy products, and lean protein.  Do not eat a lot of foods that are high in solid fats, added sugars, or sodium. Maintain a healthy weight Body mass index (BMI) is used to identify weight problems. It estimates body fat based on height and weight. Your health care provider can help determine your BMI and help you achieve or maintain a healthy weight. Get regular exercise Get regular exercise. This is one of the most important things you can do for your health. Most adults should:  Exercise for at least 150 minutes each week. The exercise should increase your heart rate and make you sweat (moderate-intensity exercise).  Do strengthening exercises at least twice a week. This is in addition to the moderate-intensity exercise.  Spend less time sitting. Even light physical activity can be beneficial. Watch cholesterol and blood lipids Have your blood tested for lipids and cholesterol at 50 years of age, then have this test every 5 years. Have your cholesterol levels checked more often if:  Your lipid or cholesterol levels are high.  You are older than 50 years of age.  You are at high risk for heart disease. What should I know about cancer screening? Depending on your health history and family history, you may need to have cancer screening at various ages. This may include screening for:  Breast cancer.  Cervical  cancer.  Colorectal cancer.  Skin cancer.  Lung cancer. What should I know about heart disease, diabetes, and high blood pressure? Blood pressure and heart disease  High blood pressure causes heart disease and increases the risk of stroke. This is more likely to develop in people who have high blood pressure readings, are of African descent, or are overweight.  Have your blood pressure checked: ? Every 3-5 years if you are 18-39 years of age. ? Every year if you are 40 years old or older. Diabetes Have regular diabetes screenings. This checks your fasting blood sugar level. Have the screening done:  Once every three years after age 40 if you are at a normal weight and have a low risk for diabetes.  More often and at a younger age if you are overweight or have a high risk for diabetes. What should I know about preventing infection? Hepatitis B If you have a higher risk for hepatitis B, you should be screened for this virus. Talk with your health care provider to find out if you are at risk for hepatitis B infection. Hepatitis C Testing is recommended for:  Everyone born from 1945 through 1965.  Anyone with known risk factors for hepatitis C. Sexually transmitted infections (STIs)  Get screened for STIs, including gonorrhea and chlamydia, if: ? You are sexually active and are younger than 50 years of age. ? You are older than 50 years of age and your health care provider tells you that you are at risk for this type of infection. ? Your sexual activity has changed since you   were last screened, and you are at increased risk for chlamydia or gonorrhea. Ask your health care provider if you are at risk.  Ask your health care provider about whether you are at high risk for HIV. Your health care provider may recommend a prescription medicine to help prevent HIV infection. If you choose to take medicine to prevent HIV, you should first get tested for HIV. You should then be tested every 3  months for as long as you are taking the medicine. Pregnancy  If you are about to stop having your period (premenopausal) and you may become pregnant, seek counseling before you get pregnant.  Take 400 to 800 micrograms (mcg) of folic acid every day if you become pregnant.  Ask for birth control (contraception) if you want to prevent pregnancy. Osteoporosis and menopause Osteoporosis is a disease in which the bones lose minerals and strength with aging. This can result in bone fractures. If you are 65 years old or older, or if you are at risk for osteoporosis and fractures, ask your health care provider if you should:  Be screened for bone loss.  Take a calcium or vitamin D supplement to lower your risk of fractures.  Be given hormone replacement therapy (HRT) to treat symptoms of menopause. Follow these instructions at home: Lifestyle  Do not use any products that contain nicotine or tobacco, such as cigarettes, e-cigarettes, and chewing tobacco. If you need help quitting, ask your health care provider.  Do not use street drugs.  Do not share needles.  Ask your health care provider for help if you need support or information about quitting drugs. Alcohol use  Do not drink alcohol if: ? Your health care provider tells you not to drink. ? You are pregnant, may be pregnant, or are planning to become pregnant.  If you drink alcohol: ? Limit how much you use to 0-1 drink a day. ? Limit intake if you are breastfeeding.  Be aware of how much alcohol is in your drink. In the [place] one drink equals one 12 oz bottle of beer (355 mL), one 5 oz glass of wine (148 mL), or one 1 oz glass of hard liquor (44 mL). General instructions  Schedule regular health, dental, and eye exams.  Stay current with your vaccines.  Tell your health care provider if: ? You often feel depressed. ? You have ever been abused or do not feel safe at home. Summary  Adopting a healthy lifestyle and getting  preventive care are important in promoting health and wellness.  Follow your health care provider's instructions about healthy diet, exercising, and getting tested or screened for diseases.  Follow your health care provider's instructions on monitoring your cholesterol and blood pressure. This information is not intended to replace advice given to you by your health care provider. Make sure you discuss any questions you have with your health care provider. Document Revised: 02/14/2018 Document Reviewed: 02/14/2018 Elsevier Patient Education  2020 Elsevier Inc.  

## 2019-12-07 LAB — COMPREHENSIVE METABOLIC PANEL
ALT: 20 IU/L (ref 0–32)
AST: 13 IU/L (ref 0–40)
Albumin/Globulin Ratio: 1.8 (ref 1.2–2.2)
Albumin: 4.3 g/dL (ref 3.8–4.8)
Alkaline Phosphatase: 90 IU/L (ref 44–121)
BUN/Creatinine Ratio: 13 (ref 9–23)
BUN: 10 mg/dL (ref 6–24)
Bilirubin Total: 0.3 mg/dL (ref 0.0–1.2)
CO2: 21 mmol/L (ref 20–29)
Calcium: 9 mg/dL (ref 8.7–10.2)
Chloride: 101 mmol/L (ref 96–106)
Creatinine, Ser: 0.76 mg/dL (ref 0.57–1.00)
GFR calc Af Amer: 106 mL/min/{1.73_m2} (ref >59)
GFR calc non Af Amer: 92 mL/min/{1.73_m2} (ref >59)
Globulin, Total: 2.4 g/dL (ref 1.5–4.5)
Glucose: 79 mg/dL (ref 65–99)
Potassium: 4 mmol/L (ref 3.5–5.2)
Sodium: 139 mmol/L (ref 134–144)
Total Protein: 6.7 g/dL (ref 6.0–8.5)

## 2019-12-07 LAB — CBC WITH DIFFERENTIAL/PLATELET
Basophils Absolute: 0 10*3/uL (ref 0.0–0.2)
Basos: 0 %
EOS (ABSOLUTE): 0.1 10*3/uL (ref 0.0–0.4)
Eos: 1 %
Hematocrit: 40.9 % (ref 34.0–46.6)
Hemoglobin: 13.3 g/dL (ref 11.1–15.9)
Immature Grans (Abs): 0 10*3/uL (ref 0.0–0.1)
Immature Granulocytes: 0 %
Lymphocytes Absolute: 1.8 10*3/uL (ref 0.7–3.1)
Lymphs: 20 %
MCH: 27 pg (ref 26.6–33.0)
MCHC: 32.5 g/dL (ref 31.5–35.7)
MCV: 83 fL (ref 79–97)
Monocytes Absolute: 0.6 10*3/uL (ref 0.1–0.9)
Monocytes: 6 %
Neutrophils Absolute: 6.6 10*3/uL (ref 1.4–7.0)
Neutrophils: 73 %
Platelets: 255 10*3/uL (ref 150–450)
RBC: 4.93 x10E6/uL (ref 3.77–5.28)
RDW: 13.5 % (ref 11.7–15.4)
WBC: 9.2 10*3/uL (ref 3.4–10.8)

## 2019-12-07 LAB — HEMOGLOBIN A1C
Est. average glucose Bld gHb Est-mCnc: 120 mg/dL
Hgb A1c MFr Bld: 5.8 % — ABNORMAL HIGH (ref 4.8–5.6)

## 2019-12-07 LAB — LIPID PANEL
Chol/HDL Ratio: 3.5 ratio (ref 0.0–4.4)
Cholesterol, Total: 190 mg/dL (ref 100–199)
HDL: 55 mg/dL (ref >39)
LDL Chol Calc (NIH): 111 mg/dL — ABNORMAL HIGH (ref 0–99)
Triglycerides: 134 mg/dL (ref 0–149)
VLDL Cholesterol Cal: 24 mg/dL (ref 5–40)

## 2019-12-07 LAB — TSH: TSH: 2.1 u[IU]/mL (ref 0.450–4.500)

## 2019-12-09 ENCOUNTER — Telehealth: Payer: Self-pay

## 2019-12-09 NOTE — Telephone Encounter (Signed)
-----   Message from Margaretann Loveless, [place] sent at 12/09/2019  1:04 PM EDT ----- Blood count is normal. Kidney and liver function are normal. Sodium, potassium, and calcium are normal. A1c/sugar has increased from 5.4 to 5.8. Limit sugars and carbs. Cholesterol is doing good and is stable. Thyroid is normal.

## 2019-12-09 NOTE — Telephone Encounter (Signed)
Blood count is normal. Kidney and liver function are normal. Sodium, potassium, and calcium are normal. A1c/sugar has increased from 5.4 to 5.8. Limit sugars and carbs. Cholesterol is doing good and is stable. Thyroid is normal.  Written by Margaretann Loveless, PA-C on 12/09/2019 1:04 PM EDT Seen by patient Heather Hester on 12/09/2019 1:40 PM

## 2019-12-11 ENCOUNTER — Encounter: Payer: Self-pay | Admitting: Physician Assistant

## 2019-12-30 ENCOUNTER — Telehealth: Payer: Self-pay | Admitting: Physician Assistant

## 2019-12-30 DIAGNOSIS — Z1239 Encounter for other screening for malignant neoplasm of breast: Secondary | ICD-10-CM

## 2019-12-30 NOTE — Telephone Encounter (Signed)
Order placed

## 2019-12-30 NOTE — Addendum Note (Signed)
Addended by: [place]son [place] on: 12/30/2019 02:26 PM   Modules accepted: Orders

## 2019-12-30 NOTE — Telephone Encounter (Signed)
Ok to order 

## 2019-12-30 NOTE — Telephone Encounter (Signed)
Per Efraim Kaufmann at [place] pt will need an order for right breast limited ultrasound [id number]

## 2020-01-09 LAB — EXTERNAL GENERIC LAB PROCEDURE: COLOGUARD: NEGATIVE

## 2020-01-09 LAB — COLOGUARD
COLOGUARD: NEGATIVE
Cologuard: NEGATIVE

## 2020-01-21 ENCOUNTER — Ambulatory Visit
Admission: RE | Admit: 2020-01-21 | Discharge: 2020-01-21 | Disposition: A | Discharge status: Home / Self Care | Payer: BC Managed Care – PPO | Source: Ambulatory Visit | Attending: Physician Assistant | Admitting: Physician Assistant

## 2020-01-21 ENCOUNTER — Ambulatory Visit
Admission: RE | Admit: 2020-01-21 | Discharge: 2020-01-21 | Disposition: A | Discharge status: Home / Self Care | Payer: BC Managed Care – PPO | Source: Ambulatory Visit | Attending: Family Medicine | Admitting: Family Medicine

## 2020-01-21 ENCOUNTER — Other Ambulatory Visit: Payer: Self-pay

## 2020-01-21 DIAGNOSIS — Z1239 Encounter for other screening for malignant neoplasm of breast: Secondary | ICD-10-CM | POA: Diagnosis present

## 2020-03-04 ENCOUNTER — Telehealth: Payer: Self-pay

## 2020-03-04 NOTE — Telephone Encounter (Signed)
Copied from CRM [id number]. Topic: General - Other >> Mar 04, 2020  2:33 PM Randol Kern wrote: Reason for CRM: Pt returned missed call, no vm left. Wants return call from office if she needs to be contacted for some reason. Please advise  [phone]

## 2020-08-27 LAB — HM DIABETES EYE EXAM

## 2020-08-28 ENCOUNTER — Encounter: Payer: Self-pay | Admitting: Family Medicine

## 2020-12-04 IMAGING — MG DIGITAL DIAGNOSTIC BILAT W/ TOMO W/ CAD
8 series · 8 of 24 positions shown · non-contrast
Comparison: Previous exam(s).

CLINICAL DATA: 49-year-old female presenting for delayed follow-up
from a benign biopsy in Monday September, 2017. She had a subareolar mass in
the right breast with pathology indicating a fibroepithelial lesion
with canalicular pattern in usual ductal hyperplasia.

EXAM:
DIGITAL DIAGNOSTIC BILATERAL MAMMOGRAM WITH TOMO AND CAD; ULTRASOUND
RIGHT BREAST LIMITED

[L MLO synth-2D]
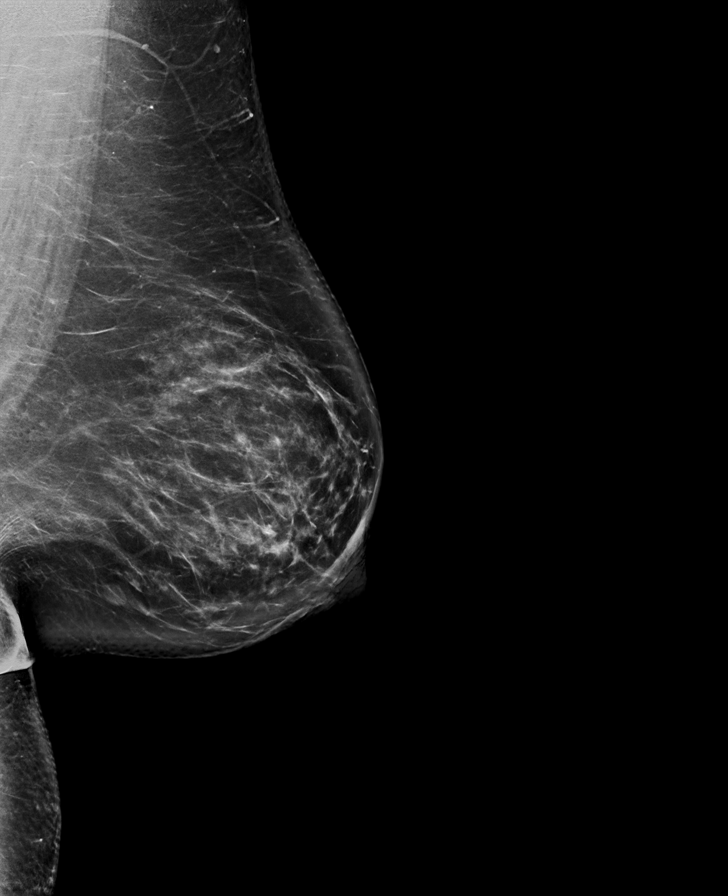

[R MLO synth-2D]
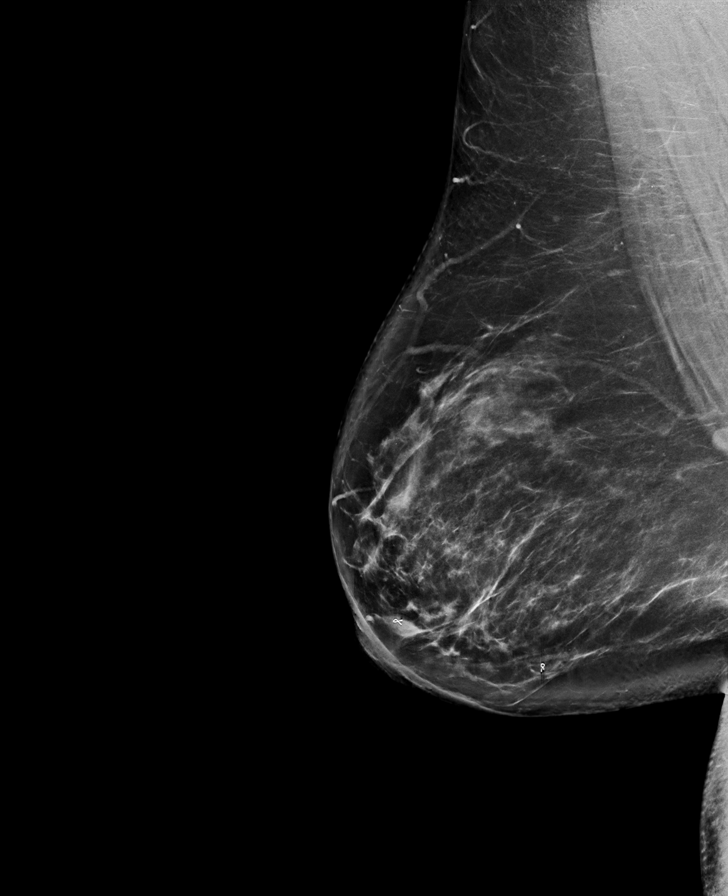

[R CC synth-2D]
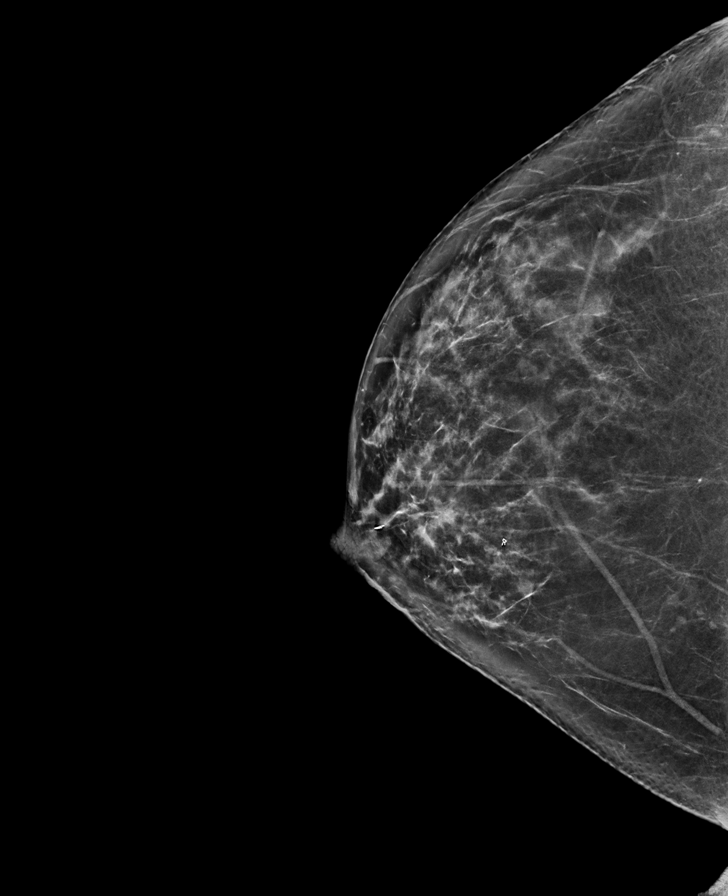

[L CC synth-2D]
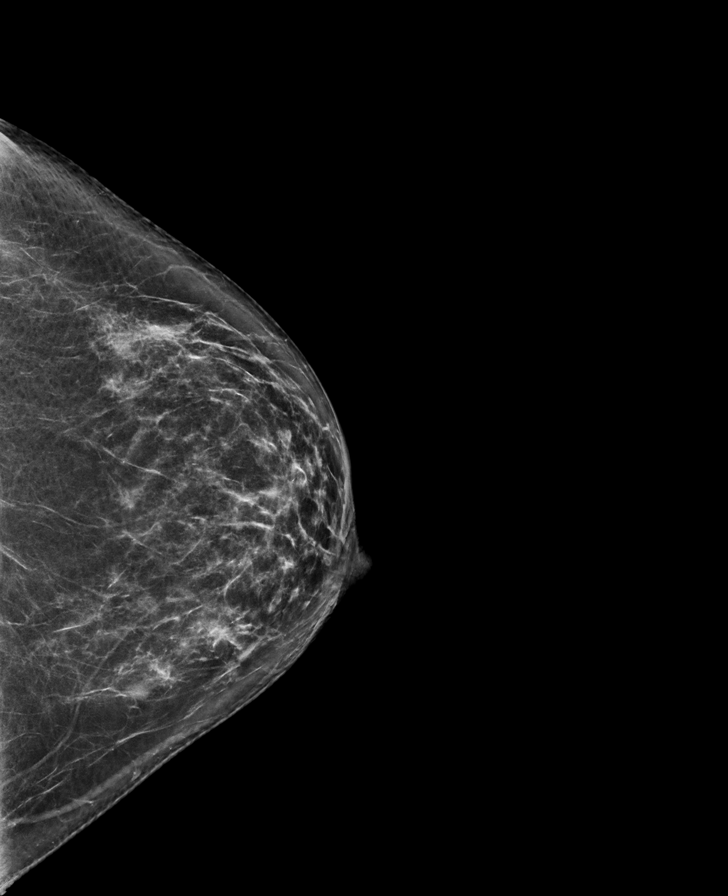

[R MLO tomo · tomo slice 50/99.0]
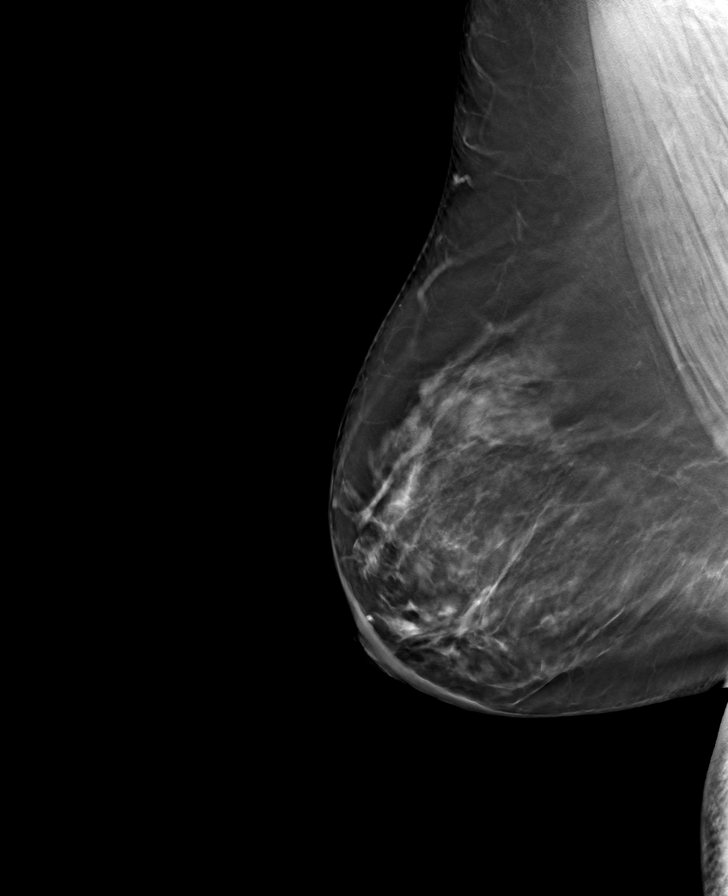

[R CC tomo · tomo slice 39/78.0]
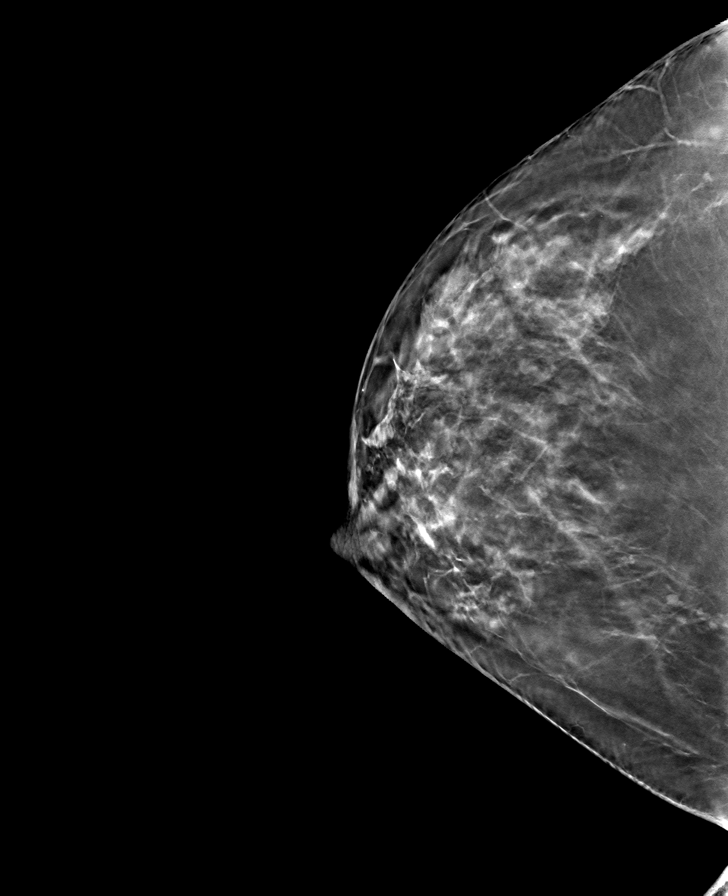

[L CC tomo · tomo slice 41/80.0]
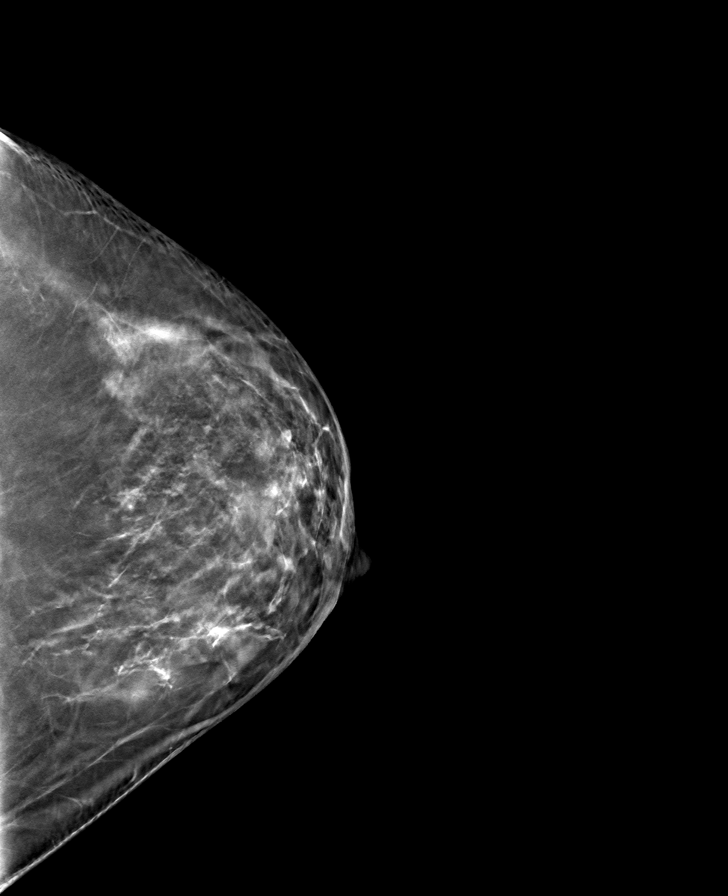

[L MLO tomo · tomo slice 49/96.0]
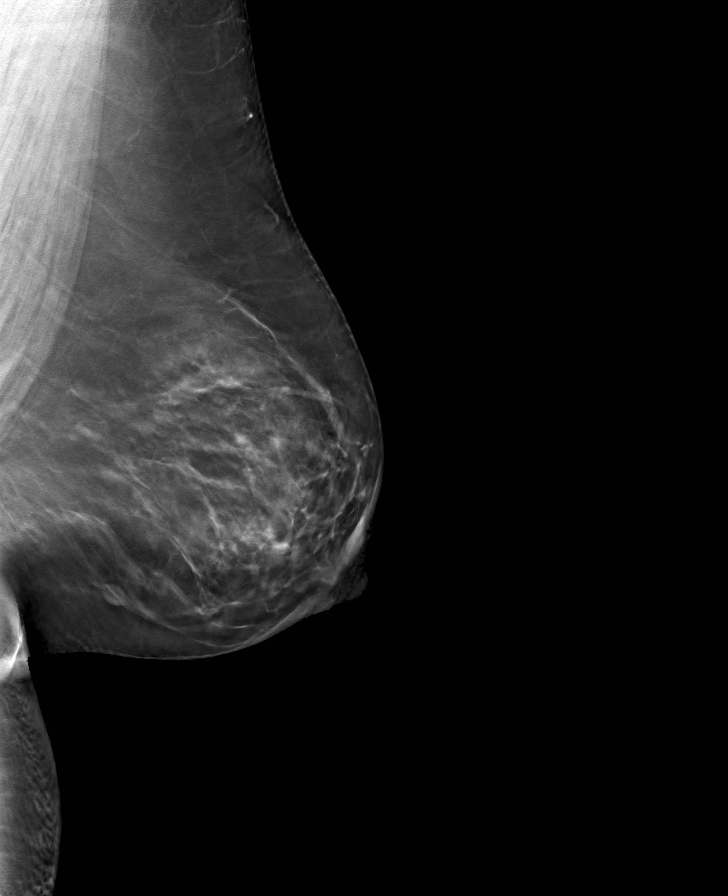

[8 of 24 positions shown; findings below may reference images not displayed]

ACR Breast Density Category b: There are scattered areas of
fibroglandular density.
FINDINGS: No suspicious calcifications, masses or areas of distortion are seen
in the bilateral breasts. The mass in the retroareolar right breast
corresponding with the fibroepithelial lesion biopsied in 5773
appears stable to smaller mammographically.

Mammographic images were processed with CAD.

Ultrasound of the retroareolar right breast demonstrates no
significant interval change over the last 2 years in the
retroareolar mass, today measuring 1.3 x 1.0 x 1.0 cm, previously
measuring 1.0 x 0.7 x 0.8 cm.
IMPRESSION: 1. The previously biopsied benign retroareolar right breast mass is
stable.

2.  No mammographic evidence of malignancy in the bilateral breasts.

RECOMMENDATION:
Screening mammogram in one year.(Code:RH-C-54A)

I have discussed the findings and recommendations with the patient.
If applicable, a reminder letter will be sent to the patient
regarding the next appointment.

BI-RADS CATEGORY  2: Benign.

## 2021-01-06 ENCOUNTER — Ambulatory Visit (INDEPENDENT_AMBULATORY_CARE_PROVIDER_SITE_OTHER): Payer: BC Managed Care – PPO | Admitting: Family Medicine

## 2021-01-06 ENCOUNTER — Other Ambulatory Visit: Payer: Self-pay

## 2021-01-06 ENCOUNTER — Encounter: Payer: Self-pay | Admitting: Family Medicine

## 2021-01-06 VITALS — BP 131/87 | HR 106 | Resp 16 | Ht 62.0 in | Wt 275.4 lb

## 2021-01-06 DIAGNOSIS — R7303 Prediabetes: Secondary | ICD-10-CM | POA: Diagnosis not present

## 2021-01-06 DIAGNOSIS — K30 Functional dyspepsia: Secondary | ICD-10-CM

## 2021-01-06 DIAGNOSIS — E7800 Pure hypercholesterolemia, unspecified: Secondary | ICD-10-CM

## 2021-01-06 DIAGNOSIS — Z0000 Encounter for general adult medical examination without abnormal findings: Secondary | ICD-10-CM | POA: Insufficient documentation

## 2021-01-06 DIAGNOSIS — H3561 Retinal hemorrhage, right eye: Secondary | ICD-10-CM

## 2021-01-06 DIAGNOSIS — Z1159 Encounter for screening for other viral diseases: Secondary | ICD-10-CM

## 2021-01-06 DIAGNOSIS — Z1231 Encounter for screening mammogram for malignant neoplasm of breast: Secondary | ICD-10-CM

## 2021-01-06 NOTE — Assessment & Plan Note (Signed)
Things to do to keep yourself healthy  °- Exercise at least 30-45 minutes a day, 3-4 days a week.  °- Eat a low-fat diet with lots of fruits and vegetables, up to 7-9 servings per day.  °- Seatbelts can save your life. Wear them always.  °- Smoke detectors on every level of your home, check batteries every year.  °- Eye Doctor - have an eye exam every 1-2 years  °- Safe sex - if you may be exposed to STDs, use a condom.  °- Alcohol -  If you drink, do it moderately, less than 2 drinks per day.  °- Health Care Power of Attorney. Choose someone to speak for you if you are not able.  °- Depression is common in our stressful world.If you're feeling down or losing interest in things you normally enjoy, please come in for a visit.  °- Violence - If anyone is threatening or hurting you, please call immediately. ° °UTD on dental and eye exams °

## 2021-01-06 NOTE — Assessment & Plan Note (Signed)
Repeat A1c 

## 2021-01-06 NOTE — Progress Notes (Signed)
Complete physical exam   Patient: Heather Hester   DOB: [birth year]   50 y.o. Female  MRN: [id number] Visit Date: 01/06/2021  Today's healthcare provider: Jacky Kindle, FNP   Chief Complaint  Patient presents with   Annual Exam   Subjective    HPI  Heather Hester is a 50 y.o. female who presents today for a complete physical exam.  She reports consuming a general diet. The patient does not participate in regular exercise at present. She generally feels fairly well, patient reports that she is getting over cold like symptoms since returning back from her trip, she states that she has been taking otc Mucinex for relief. . She reports sleeping fairly well. She does not have additional problems to discuss today.  Last reported Pap-05/26/17 Mammogram-01/21/20 Diagnostic and Ultrasound Colonoscopy-01/09/2020  History reviewed. No pertinent past medical history. Past Surgical History:  Procedure Laterality Date   BREAST BIOPSY Right 09/23/2007   core bx with clip   BREAST BIOPSY Right 09/05/2017   neg   CARPAL TUNNEL RELEASE Left 2010   Social History   Socioeconomic History   Marital status: Single    Spouse name: Not on file   Number of children: 0   Years of education: HS   Highest education level: Not on file  Occupational History   Occupation: Air cabin crew    Comment: Full-Time  Tobacco Use   Smoking status: Never   Smokeless tobacco: Never  Substance and Sexual Activity   Alcohol use: No    Alcohol/week: 0.0 standard drinks   Drug use: No   Sexual activity: Not on file  Other Topics Concern   Not on file  Social History Narrative   Not on file   Social Determinants of Health   Financial Resource Strain: Not on file  Food Insecurity: Not on file  Transportation Needs: Not on file  Physical Activity: Not on file  Stress: Not on file  Social Connections: Not on file  Intimate Partner Violence: Not on file   Family Status   Relation Name Status   Mother  Alive   Father  Deceased   Brother  Alive   Neg Hx  (Not Specified)   Family History  Problem Relation Age of Onset   COPD Mother    Depression Mother    Kidney disease Father    Hypertension Brother    Sleep apnea Brother    Breast cancer Neg Hx    No Known Allergies  Patient Care Team: Jacky Kindle, FNP as PCP - General (Family Medicine)   Medications: Outpatient Medications Prior to Visit  Medication Sig   Multiple Vitamin (MULTI-VITAMINS PO) Take by mouth.   [DISCONTINUED] CALCIUM CARBONATE PO Take by mouth.   [DISCONTINUED] Cholecalciferol (VITAMIN D) 2000 UNITS CAPS Take 1 capsule (2,000 Units total) by mouth daily. (Patient taking differently: Take 1 capsule by mouth at bedtime. )   [DISCONTINUED] Coenzyme Q10 (COQ-10 PO) Take by mouth.   [DISCONTINUED] [employer] MILK THISTLE PO Take by mouth.   [DISCONTINUED] Cyanocobalamin (VITAMIN B-12 PO) Take by mouth.   [DISCONTINUED] D-Mannose 500 MG CAPS Take 500 mg by mouth 3 (three) times daily as needed.   [DISCONTINUED] MAGNESIUM PO Take by mouth.   No facility-administered medications prior to visit.    Review of Systems    Objective    BP 131/87   Pulse (!) 106   Resp 16   Ht 5\' 2"  (1.575 m)  Wt 275 lb 6.4 oz (124.9 kg)   LMP  (Within Weeks)   SpO2 96%   BMI 50.37 kg/m    Physical Exam Vitals and nursing note reviewed.  Constitutional:      General: She is awake. She is not in acute distress.    Appearance: Normal appearance. She is well-developed and well-groomed. She is obese. She is not ill-appearing, toxic-appearing or diaphoretic.  HENT:     Head: Normocephalic and atraumatic.     Jaw: There is normal jaw occlusion. No trismus, tenderness, swelling or pain on movement.     Right Ear: Hearing, tympanic membrane, ear canal and external ear normal. There is no impacted cerumen.     Left Ear: Hearing, tympanic membrane, ear canal and external ear normal. There is no  impacted cerumen.     Nose: Nose normal. No congestion or rhinorrhea.     Right Turbinates: Not enlarged, swollen or pale.     Left Turbinates: Not enlarged, swollen or pale.     Right Sinus: No maxillary sinus tenderness or frontal sinus tenderness.     Left Sinus: No maxillary sinus tenderness or frontal sinus tenderness.     Mouth/Throat:     Lips: Pink.     Mouth: Mucous membranes are moist. No injury.     Tongue: No lesions.     Pharynx: Oropharynx is clear. Uvula midline. No pharyngeal swelling, oropharyngeal exudate, posterior oropharyngeal erythema or uvula swelling.     Tonsils: No tonsillar exudate or tonsillar abscesses.  Eyes:     General: Lids are normal. Lids are everted, no foreign bodies appreciated. Vision grossly intact. Gaze aligned appropriately. No allergic shiner or visual field deficit.       Right eye: No discharge.        Left eye: No discharge.     Extraocular Movements: Extraocular movements intact.     Conjunctiva/sclera: Conjunctivae normal.     Right eye: Right conjunctiva is not injected. No exudate.    Left eye: Left conjunctiva is not injected. No exudate.    Pupils: Pupils are equal, round, and reactive to light.  Neck:     Thyroid: No thyroid mass, thyromegaly or thyroid tenderness.     Vascular: No carotid bruit.     Trachea: Trachea normal.  Cardiovascular:     Rate and Rhythm: Normal rate and regular rhythm.     Pulses: Normal pulses.          Carotid pulses are 2+ on the right side and 2+ on the left side.      Radial pulses are 2+ on the right side and 2+ on the left side.       Dorsalis pedis pulses are 2+ on the right side and 2+ on the left side.       Posterior tibial pulses are 2+ on the right side and 2+ on the left side.     Heart sounds: Normal heart sounds, S1 normal and S2 normal. No murmur heard.   No friction rub. No gallop.  Pulmonary:     Effort: Pulmonary effort is normal. No respiratory distress.     Breath sounds: Decreased  air movement present. No stridor. Examination of the right-upper field reveals wheezing. Examination of the left-upper field reveals wheezing. Examination of the left-lower field reveals decreased breath sounds. Decreased breath sounds and wheezing present. No rhonchi or rales.     Comments: Recent URI; does not need additional workup today Chest:  Chest wall: No tenderness.     Comments: Breast exam deferred; discussed 'know your lemons' campaign and self exam Abdominal:     General: Abdomen is flat. Bowel sounds are normal. There is no distension.     Palpations: Abdomen is soft. There is no mass.     Tenderness: There is no abdominal tenderness. There is no right CVA tenderness, left CVA tenderness, guarding or rebound.     Hernia: No hernia is present.  Genitourinary:    Comments: Exam deferred; denies complaints Musculoskeletal:        General: No swelling, tenderness, deformity or signs of injury. Normal range of motion.     Cervical back: Full passive range of motion without pain, normal range of motion and neck supple. No edema, rigidity or tenderness. No muscular tenderness.     Right lower leg: No edema.     Left lower leg: No edema.  Lymphadenopathy:     Cervical: No cervical adenopathy.     Right cervical: No superficial, deep or posterior cervical adenopathy.    Left cervical: No superficial, deep or posterior cervical adenopathy.  Skin:    General: Skin is warm and dry.     Capillary Refill: Capillary refill takes less than 2 seconds.     Coloration: Skin is not jaundiced or pale.     Findings: No bruising, erythema, lesion or rash.  Neurological:     General: No focal deficit present.     Mental Status: She is alert and oriented to person, place, and time. Mental status is at baseline.     GCS: GCS eye subscore is 4. GCS verbal subscore is 5. GCS motor subscore is 6.     Sensory: Sensation is intact. No sensory deficit.     Motor: Motor function is intact. No  weakness.     Coordination: Coordination is intact. Coordination normal.     Gait: Gait is intact. Gait normal.  Psychiatric:        Attention and Perception: Attention and perception normal.        Mood and Affect: Mood and affect normal.        Speech: Speech normal.        Behavior: Behavior normal. Behavior is cooperative.        Thought Content: Thought content normal.        Cognition and Memory: Cognition and memory normal.        Judgment: Judgment normal.     Last depression screening scores PHQ 2/9 Scores 12/06/2019 08/02/2018 05/26/2017  PHQ - 2 Score 1 0 0  PHQ- 9 Score 2 0 -   Last fall risk screening Fall Risk  12/06/2019  Falls in the past year? 0  Number falls in past yr: 0  Injury with Fall? 0  Risk for fall due to : No Fall Risks  Follow up Falls evaluation completed   Last Audit-C alcohol use screening Alcohol Use Disorder Test (AUDIT) 12/06/2019  1. How often do you have a drink containing alcohol? 1  2. How many drinks containing alcohol do you have on a typical day when you are drinking? 0  3. How often do you have six or more drinks on one occasion? 0  AUDIT-C Score 1  Alcohol Brief Interventions/Follow-up AUDIT Score <7 follow-up not indicated   A score of 3 or more in women, and 4 or more in men indicates increased risk for alcohol abuse, EXCEPT if all of the points are from question  1   No results found for any visits on 01/06/21.  Assessment & Plan    Routine Health Maintenance and Physical Exam  Exercise Activities and Dietary recommendations  Goals      diet     Going to due formal diet.      Exercise 150 minutes per week (moderate activity)     Exercise 150 minutes per week (moderate activity)        Immunization History  Administered Date(s) Administered   Influenza,inj,Quad PF,6+ Mos 11/28/2012, 02/06/2014, 11/24/2016, 12/06/2019   Tdap 11/28/2012    Health Maintenance  Topic Date Due   COVID-19 Vaccine (1) Never done    Hepatitis C Screening  Never done   Zoster Vaccines- Shingrix (1 of 2) Never done   PAP SMEAR-Modifier  05/26/2020   INFLUENZA VACCINE  06/04/2021 (Originally 10/05/2020)   MAMMOGRAM  01/20/2022   TETANUS/TDAP  11/29/2022   Fecal DNA (Cologuard)  01/09/2023   HIV Screening  Completed   Pneumococcal Vaccine 68-27 Years old  Aged Out   HPV VACCINES  Aged Out    Discussed health benefits of physical activity, and encouraged her to engage in regular exercise appropriate for her age and condition.  Problem List Items Addressed This Visit       Other   Elevated LDL cholesterol level    Lipid placed; we recommend diet low in saturated fat and regular exercise - 30 min at least 5 times per week       Relevant Orders   Lipid panel   Prediabetes    Repeat A1c      Relevant Orders   Hemoglobin A1c   Comprehensive Metabolic Panel (CMET)   Encounter for screening mammogram for malignant neoplasm of breast    Denies concerns      Relevant Orders   MS DIGITAL SCREENING TOMO BILATERAL   Annual physical exam - Primary    Things to do to keep yourself healthy  - Exercise at least 30-45 minutes a day, 3-4 days a week.  - Eat a low-fat diet with lots of fruits and vegetables, up to 7-9 servings per day.  - Seatbelts can save your life. Wear them always.  - Smoke detectors on every level of your home, check batteries every year.  - Eye Doctor - have an eye exam every 1-2 years  - Safe sex - if you may be exposed to STDs, use a condom.  - Alcohol -  If you drink, do it moderately, less than 2 drinks per day.  - Health Care Power of Attorney. Choose someone to speak for you if you are not able.  - Depression is common in our stressful world.If you're feeling down or losing interest in things you normally enjoy, please come in for a visit.  - Violence - If anyone is threatening or hurting you, please call immediately.  UTD on dental and eye exams      Obesity, morbid, BMI 50 or higher  (HCC)    BMI 50.37 Discussed importance of healthy weight management Discussed diet and exercise       Relevant Orders   Hemoglobin A1c   CBC with Differential/Platelet   Comprehensive Metabolic Panel (CMET)   Retinal hemorrhage noted on examination of right eye    Recommend repeat blood work given hx of retinopathy on recent eye exam      Relevant Orders   CBC with Differential/Platelet   Comprehensive Metabolic Panel (CMET)   Iron, TIBC and Ferritin  Panel   Indigestion    Noted with greasy or fried foods or spicy foods ex [place] Did not take any OTC rx and does not wish to take daily medication      Encounter for hepatitis C screening test for low risk patient    Lab pending      Relevant Orders   Hepatitis C Antibody     Return in about 6 months (around 07/06/2021) for chonic disease management.     Leilani Merl, FNP, have reviewed all documentation for this visit. The documentation on 01/06/21 for the exam, diagnosis, procedures, and orders are all accurate and complete.    Jacky Kindle, FNP  [facility] [phone] (phone) [phone] (fax)  [facility]

## 2021-01-06 NOTE — Assessment & Plan Note (Signed)
Noted with greasy or fried foods or spicy foods ex [place] Did not take any OTC rx and does not wish to take daily medication

## 2021-01-06 NOTE — Assessment & Plan Note (Signed)
Lab pending.

## 2021-01-06 NOTE — Assessment & Plan Note (Signed)
Lipid placed; we recommend diet low in saturated fat and regular exercise - 30 min at least 5 times per week

## 2021-01-06 NOTE — Assessment & Plan Note (Signed)
Recommend repeat blood work given hx of retinopathy on recent eye exam

## 2021-01-06 NOTE — Assessment & Plan Note (Signed)
Denies concerns.

## 2021-01-06 NOTE — Assessment & Plan Note (Signed)
BMI 50.37 Discussed importance of healthy weight management Discussed diet and exercise

## 2021-01-07 LAB — IRON,TIBC AND FERRITIN PANEL
Ferritin: 29 ng/mL (ref 15–150)
Iron Saturation: 11 % — ABNORMAL LOW (ref 15–55)
Iron: 44 ug/dL (ref 27–159)
Total Iron Binding Capacity: 383 ug/dL (ref 250–450)
UIBC: 339 ug/dL (ref 131–425)

## 2021-01-07 LAB — CBC WITH DIFFERENTIAL/PLATELET
Basophils Absolute: 0 10*3/uL (ref 0.0–0.2)
Basos: 0 %
EOS (ABSOLUTE): 0.1 10*3/uL (ref 0.0–0.4)
Eos: 2 %
Hematocrit: 42.5 % (ref 34.0–46.6)
Hemoglobin: 13.6 g/dL (ref 11.1–15.9)
Immature Grans (Abs): 0 10*3/uL (ref 0.0–0.1)
Immature Granulocytes: 0 %
Lymphocytes Absolute: 1.7 10*3/uL (ref 0.7–3.1)
Lymphs: 34 %
MCH: 26.9 pg (ref 26.6–33.0)
MCHC: 32 g/dL (ref 31.5–35.7)
MCV: 84 fL (ref 79–97)
Monocytes Absolute: 0.4 10*3/uL (ref 0.1–0.9)
Monocytes: 7 %
Neutrophils Absolute: 2.9 10*3/uL (ref 1.4–7.0)
Neutrophils: 57 %
Platelets: 203 10*3/uL (ref 150–450)
RBC: 5.06 x10E6/uL (ref 3.77–5.28)
RDW: 14.4 % (ref 11.7–15.4)
WBC: 5.1 10*3/uL (ref 3.4–10.8)

## 2021-01-07 LAB — LIPID PANEL
Chol/HDL Ratio: 3.8 ratio (ref 0.0–4.4)
Cholesterol, Total: 187 mg/dL (ref 100–199)
HDL: 49 mg/dL (ref >39)
LDL Chol Calc (NIH): 124 mg/dL — ABNORMAL HIGH (ref 0–99)
Triglycerides: 75 mg/dL (ref 0–149)
VLDL Cholesterol Cal: 14 mg/dL (ref 5–40)

## 2021-01-07 LAB — COMPREHENSIVE METABOLIC PANEL
ALT: 32 IU/L (ref 0–32)
AST: 18 IU/L (ref 0–40)
Albumin/Globulin Ratio: 2 (ref 1.2–2.2)
Albumin: 4.3 g/dL (ref 3.8–4.9)
Alkaline Phosphatase: 86 IU/L (ref 44–121)
BUN/Creatinine Ratio: 18 (ref 9–23)
BUN: 13 mg/dL (ref 6–24)
Bilirubin Total: 0.3 mg/dL (ref 0.0–1.2)
CO2: 22 mmol/L (ref 20–29)
Calcium: 8.7 mg/dL (ref 8.7–10.2)
Chloride: 103 mmol/L (ref 96–106)
Creatinine, Ser: 0.73 mg/dL (ref 0.57–1.00)
Globulin, Total: 2.2 g/dL (ref 1.5–4.5)
Glucose: 102 mg/dL — ABNORMAL HIGH (ref 70–99)
Potassium: 4.2 mmol/L (ref 3.5–5.2)
Sodium: 139 mmol/L (ref 134–144)
Total Protein: 6.5 g/dL (ref 6.0–8.5)
eGFR: 100 mL/min/{1.73_m2} (ref >59)

## 2021-01-07 LAB — HEPATITIS C ANTIBODY: Hep C Virus Ab: <0.1 s/co ratio (ref 0.0–0.9)

## 2021-01-07 LAB — HEMOGLOBIN A1C
Est. average glucose Bld gHb Est-mCnc: 114 mg/dL
Hgb A1c MFr Bld: 5.6 % (ref 4.8–5.6)

## 2021-01-14 ENCOUNTER — Encounter: Payer: Self-pay | Admitting: Family Medicine

## 2021-02-16 ENCOUNTER — Other Ambulatory Visit: Payer: Self-pay

## 2021-02-16 ENCOUNTER — Ambulatory Visit
Admission: RE | Admit: 2021-02-16 | Discharge: 2021-02-16 | Disposition: A | Discharge status: Home / Self Care | Payer: BC Managed Care – PPO | Source: Ambulatory Visit | Attending: Family Medicine | Admitting: Family Medicine

## 2021-02-16 DIAGNOSIS — Z1231 Encounter for screening mammogram for malignant neoplasm of breast: Secondary | ICD-10-CM | POA: Insufficient documentation

## 2021-07-09 ENCOUNTER — Ambulatory Visit: Payer: BC Managed Care – PPO | Admitting: Family Medicine

## 2022-01-14 ENCOUNTER — Encounter: Payer: Self-pay | Admitting: Family Medicine

## 2022-01-14 ENCOUNTER — Ambulatory Visit (INDEPENDENT_AMBULATORY_CARE_PROVIDER_SITE_OTHER): Payer: BC Managed Care – PPO | Admitting: Family Medicine

## 2022-01-14 VITALS — BP 140/92 | HR 92 | Resp 16 | Ht 62.0 in | Wt 274.0 lb

## 2022-01-14 DIAGNOSIS — R7303 Prediabetes: Secondary | ICD-10-CM

## 2022-01-14 DIAGNOSIS — E7800 Pure hypercholesterolemia, unspecified: Secondary | ICD-10-CM

## 2022-01-14 DIAGNOSIS — Z23 Encounter for immunization: Secondary | ICD-10-CM | POA: Diagnosis not present

## 2022-01-14 DIAGNOSIS — Z0000 Encounter for general adult medical examination without abnormal findings: Secondary | ICD-10-CM | POA: Diagnosis not present

## 2022-01-14 DIAGNOSIS — R030 Elevated blood-pressure reading, without diagnosis of hypertension: Secondary | ICD-10-CM | POA: Insufficient documentation

## 2022-01-14 DIAGNOSIS — Z1231 Encounter for screening mammogram for malignant neoplasm of breast: Secondary | ICD-10-CM

## 2022-01-14 NOTE — Assessment & Plan Note (Signed)
Chronic, stable Repeat A1c Continue to recommend balanced, lower carb meals. Smaller meal size, adding snacks. Choosing water as drink of choice and increasing purposeful exercise.  

## 2022-01-14 NOTE — Assessment & Plan Note (Signed)
Chronic, increase Body mass index is 50.12 kg/m. Discussed importance of healthy weight management Discussed diet and exercise

## 2022-01-14 NOTE — Assessment & Plan Note (Signed)
UTD on dental and vision Things to do to keep yourself healthy  - Exercise at least 30-45 minutes a day, 3-4 days a week.  - Eat a low-fat diet with lots of fruits and vegetables, up to 7-9 servings per day.  - Seatbelts can save your life. Wear them always.  - Smoke detectors on every level of your home, check batteries every year.  - Eye Doctor - have an eye exam every 1-2 years  - Safe sex - if you may be exposed to STDs, use a condom.  - Alcohol -  If you drink, do it moderately, less than 2 drinks per day.  - Health Care Power of Attorney. Choose someone to speak for you if you are not able.  - Depression is common in our stressful world.If you're feeling down or losing interest in things you normally enjoy, please come in for a visit.  - Violence - If anyone is threatening or hurting you, please call immediately.  

## 2022-01-14 NOTE — Progress Notes (Signed)
Complete physical exam   Patient: Heather Hester   DOB: [birth year]   51 y.o. Female  MRN: [id number] Visit Date: 01/14/2022  Today's healthcare provider: Jacky Kindle, FNP  Re Introduced to nurse practitioner role and practice setting.  All questions answered.  Discussed provider/patient relationship and expectations.  I,Tiffany J Bragg,acting as a scribe for Jacky Kindle, FNP.,have documented all relevant documentation on the behalf of Jacky Kindle, FNP,as directed by  Jacky Kindle, FNP while in the presence of Jacky Kindle, FNP.  Subjective    Heather Hester is a 51 y.o. female who presents today for a complete physical exam.  She reports consuming a general diet. Home exercise routine includes walking, housework. She generally feels well. She reports sleeping well. She does not have additional problems to discuss today.  HPI   History reviewed. No pertinent past medical history. Past Surgical History:  Procedure Laterality Date   BREAST BIOPSY Right 09/23/2007   core bx with clip   BREAST BIOPSY Right 09/05/2017   neg   CARPAL TUNNEL RELEASE Left 2010   Social History   Socioeconomic History   Marital status: Single    Spouse name: Not on file   Number of children: 0   Years of education: HS   Highest education level: Not on file  Occupational History   Occupation: Air cabin crew    Comment: Full-Time  Tobacco Use   Smoking status: Never   Smokeless tobacco: Never  Substance and Sexual Activity   Alcohol use: No    Alcohol/week: 0.0 standard drinks of alcohol   Drug use: No   Sexual activity: Not on file  Other Topics Concern   Not on file  Social History Narrative   Not on file   Social Determinants of Health   Financial Resource Strain: Not on file  Food Insecurity: Not on file  Transportation Needs: Not on file  Physical Activity: Not on file  Stress: Not on file  Social Connections: Not on file  Intimate Partner  Violence: Not on file   Family Status  Relation Name Status   Mother  Alive   Father  Deceased   Brother  Alive   Neg Hx  (Not Specified)   Family History  Problem Relation Age of Onset   COPD Mother    Depression Mother    Kidney disease Father    Hypertension Brother    Sleep apnea Brother    Breast cancer Neg Hx    No Known Allergies  Patient Care Team: Jacky Kindle, FNP as PCP - General (Family Medicine)   Medications: Outpatient Medications Prior to Visit  Medication Sig   Multiple Vitamin (MULTI-VITAMINS PO) Take by mouth.   No facility-administered medications prior to visit.    Review of Systems   Objective    BP (!) 140/92 (BP Location: Left Arm, Patient Position: Sitting, Cuff Size: Large)   Pulse 92   Resp 16   Ht 5\' 2"  (1.575 m)   Wt 274 lb (124.3 kg)   SpO2 98%   BMI 50.12 kg/m    Physical Exam Vitals and nursing note reviewed.  Constitutional:      General: She is awake. She is not in acute distress.    Appearance: Normal appearance. She is well-developed and well-groomed. She is obese. She is not ill-appearing, toxic-appearing or diaphoretic.  HENT:     Head: Normocephalic and atraumatic.  Jaw: There is normal jaw occlusion. No trismus, tenderness, swelling or pain on movement.     Right Ear: Hearing, tympanic membrane, ear canal and external ear normal. There is no impacted cerumen.     Left Ear: Hearing, tympanic membrane, ear canal and external ear normal. There is no impacted cerumen.     Nose: Nose normal. No congestion or rhinorrhea.     Right Turbinates: Not enlarged, swollen or pale.     Left Turbinates: Not enlarged, swollen or pale.     Right Sinus: No maxillary sinus tenderness or frontal sinus tenderness.     Left Sinus: No maxillary sinus tenderness or frontal sinus tenderness.     Mouth/Throat:     Lips: Pink.     Mouth: Mucous membranes are moist. No injury.     Tongue: No lesions.     Pharynx: Oropharynx is clear.  Uvula midline. No pharyngeal swelling, oropharyngeal exudate, posterior oropharyngeal erythema or uvula swelling.     Tonsils: No tonsillar exudate or tonsillar abscesses.  Eyes:     General: Lids are normal. Lids are everted, no foreign bodies appreciated. Vision grossly intact. Gaze aligned appropriately. No allergic shiner or visual field deficit.       Right eye: No discharge.        Left eye: No discharge.     Extraocular Movements: Extraocular movements intact.     Conjunctiva/sclera: Conjunctivae normal.     Right eye: Right conjunctiva is not injected. No exudate.    Left eye: Left conjunctiva is not injected. No exudate.    Pupils: Pupils are equal, round, and reactive to light.  Neck:     Thyroid: No thyroid mass, thyromegaly or thyroid tenderness.     Vascular: No carotid bruit.     Trachea: Trachea normal.  Cardiovascular:     Rate and Rhythm: Normal rate and regular rhythm.     Pulses: Normal pulses.          Carotid pulses are 2+ on the right side and 2+ on the left side.      Radial pulses are 2+ on the right side and 2+ on the left side.       Dorsalis pedis pulses are 2+ on the right side and 2+ on the left side.       Posterior tibial pulses are 2+ on the right side and 2+ on the left side.     Heart sounds: Normal heart sounds, S1 normal and S2 normal. No murmur heard.    No friction rub. No gallop.  Pulmonary:     Effort: Pulmonary effort is normal. No respiratory distress.     Breath sounds: Normal breath sounds and air entry. No stridor. No wheezing, rhonchi or rales.  Chest:     Chest wall: No tenderness.  Abdominal:     General: Abdomen is flat. Bowel sounds are normal. There is no distension.     Palpations: Abdomen is soft. There is no mass.     Tenderness: There is no abdominal tenderness. There is no right CVA tenderness, left CVA tenderness, guarding or rebound.     Hernia: No hernia is present.  Genitourinary:    Comments: Exam deferred; denies  complaints Musculoskeletal:        General: No swelling, tenderness, deformity or signs of injury. Normal range of motion.     Cervical back: Full passive range of motion without pain, normal range of motion and neck supple. No edema, rigidity or  tenderness. No muscular tenderness.     Right lower leg: No edema.     Left lower leg: No edema.  Lymphadenopathy:     Cervical: No cervical adenopathy.     Right cervical: No superficial, deep or posterior cervical adenopathy.    Left cervical: No superficial, deep or posterior cervical adenopathy.  Skin:    General: Skin is warm and dry.     Capillary Refill: Capillary refill takes less than 2 seconds.     Coloration: Skin is not jaundiced or pale.     Findings: No bruising, erythema, lesion or rash.  Neurological:     General: No focal deficit present.     Mental Status: She is alert and oriented to person, place, and time. Mental status is at baseline.     GCS: GCS eye subscore is 4. GCS verbal subscore is 5. GCS motor subscore is 6.     Sensory: Sensation is intact. No sensory deficit.     Motor: Motor function is intact. No weakness.     Coordination: Coordination is intact. Coordination normal.     Gait: Gait is intact. Gait normal.  Psychiatric:        Attention and Perception: Attention and perception normal.        Mood and Affect: Mood and affect normal.        Speech: Speech normal.        Behavior: Behavior normal. Behavior is cooperative.        Thought Content: Thought content normal.        Cognition and Memory: Cognition and memory normal.        Judgment: Judgment normal.     Last depression screening scores    01/14/2022    2:30 PM 12/06/2019    9:14 AM 08/02/2018    9:00 AM  PHQ 2/9 Scores  PHQ - 2 Score 0 1 0  PHQ- 9 Score 0 2 0   Last fall risk screening    01/14/2022    2:30 PM  Fall Risk   Falls in the past year? 0  Number falls in past yr: 0  Injury with Fall? 0  Risk for fall due to : No Fall Risks   Follow up Falls evaluation completed   Last Audit-C alcohol use screening    01/14/2022    2:31 PM  Alcohol Use Disorder Test (AUDIT)  1. How often do you have a drink containing alcohol? 0  2. How many drinks containing alcohol do you have on a typical day when you are drinking? 0  3. How often do you have six or more drinks on one occasion? 0  AUDIT-C Score 0   A score of 3 or more in women, and 4 or more in men indicates increased risk for alcohol abuse, EXCEPT if all of the points are from question 1   No results found for any visits on 01/14/22.  Assessment & Plan    Routine Health Maintenance and Physical Exam  Exercise Activities and Dietary recommendations  Goals      diet     Going to due formal diet.      Exercise 150 minutes per week (moderate activity)     Exercise 150 minutes per week (moderate activity)        Immunization History  Administered Date(s) Administered   Influenza,inj,Quad PF,6+ Mos 11/28/2012, 02/06/2014, 11/24/2016, 12/06/2019, 01/14/2022   Tdap 11/28/2012   Zoster Recombinat (Shingrix) 01/14/2022    Health Maintenance  Topic Date Due   COVID-19 Vaccine (1) Never done   PAP SMEAR-Modifier  05/26/2020   Zoster Vaccines- Shingrix (2 of 2) 03/11/2022   TETANUS/TDAP  11/29/2022   Fecal DNA (Cologuard)  01/09/2023   MAMMOGRAM  02/17/2023   INFLUENZA VACCINE  Completed   Hepatitis C Screening  Completed   HIV Screening  Completed   HPV VACCINES  Aged Out    Discussed health benefits of physical activity, and encouraged her to engage in regular exercise appropriate for her age and condition.  Problem List Items Addressed This Visit       Other   Annual physical exam - Primary    UTD on dental and vision Things to do to keep yourself healthy  - Exercise at least 30-45 minutes a day, 3-4 days a week.  - Eat a low-fat diet with lots of fruits and vegetables, up to 7-9 servings per day.  - Seatbelts can save your life. Wear them  always.  - Smoke detectors on every level of your home, check batteries every year.  - Eye Doctor - have an eye exam every 1-2 years  - Safe sex - if you may be exposed to STDs, use a condom.  - Alcohol -  If you drink, do it moderately, less than 2 drinks per day.  - Health Care Power of Attorney. Choose someone to speak for you if you are not able.  - Depression is common in our stressful world.If you're feeling down or losing interest in things you normally enjoy, please come in for a visit.  - Violence - If anyone is threatening or hurting you, please call immediately.       Relevant Orders   Comprehensive Metabolic Panel (CMET)   CBC with Differential/Platelet   TSH + free T4   Elevated blood pressure reading in office without diagnosis of hypertension    Goal <140/<90 Defer second blood pressure check Continue to work on exercise and 5-10% weight loss to assist need for medication to control blood pressure       Elevated LDL cholesterol level    Chronic, previously elevated Controlled with diet/exercise  The 10-year ASCVD risk score (Arnett DK, et al., 2019) is: 1.9%   Values used to calculate the score:     Age: [age] years     Sex: Female     Is Non-Hispanic African American: No     Diabetic: No     Tobacco smoker: No     Systolic Blood Pressure: 140 mmHg     Is BP treated: No     HDL Cholesterol: 49 mg/dL     Total Cholesterol: 187 mg/dL Repeat LP given low risk       Relevant Orders   Lipid panel   Morbid obesity (HCC)    Chronic, increase Body mass index is 50.12 kg/m. Discussed importance of healthy weight management Discussed diet and exercise       Need for influenza vaccination   Relevant Orders   Flu Vaccine QUAD 6+ mos PF IM (Fluarix Quad PF) (Completed)   Need for shingles vaccine   Relevant Orders   Zoster Recombinant (Shingrix ) (Completed)   Prediabetes    Chronic, stable Repeat A1c Continue to recommend balanced, lower carb meals. Smaller  meal size, adding snacks. Choosing water as drink of choice and increasing purposeful exercise.       Relevant Orders   Hemoglobin A1c   Screening mammogram for breast cancer  Due for screening for mammogram, denies breast concerns, provided with phone number to call and schedule appointment for mammogram. Encouraged to repeat breast cancer screening every 1-2 years. Has not been doing home BSE      Relevant Orders   MM 3D SCREEN BREAST BILATERAL   Return in about 1 year (around 01/15/2023) for annual examination.    Leilani Merl, FNP, have reviewed all documentation for this visit. The documentation on 01/14/22 for the exam, diagnosis, procedures, and orders are all accurate and complete.  Jacky Kindle, FNP  [facility] [phone] (phone) [phone] (fax)  [facility]

## 2022-01-14 NOTE — Patient Instructions (Signed)
Please call and schedule your mammogram:  [facility]  [address], [address] [facility] [place],  [place]  [address] Get Driving Directions Main: [phone]  Sunday:Closed Monday:7:20 AM - 5:00 PM Tuesday:7:20 AM - 5:00 PM Wednesday:7:20 AM - 5:00 PM Thursday:7:20 AM - 5:00 PM Friday:7:20 AM - 4:30 PM Saturday:Closed  

## 2022-01-14 NOTE — Assessment & Plan Note (Signed)
Due for screening for mammogram, denies breast concerns, provided with phone number to call and schedule appointment for mammogram. Encouraged to repeat breast cancer screening every 1-2 years. Has not been doing home BSE

## 2022-01-14 NOTE — Assessment & Plan Note (Signed)
Chronic, previously elevated Controlled with diet/exercise  The 10-year ASCVD risk score (Arnett DK, et al., 2019) is: 1.9%   Values used to calculate the score:     Age: 51 years     Sex: Female     Is Non-Hispanic African American: No     Diabetic: No     Tobacco smoker: No     Systolic Blood Pressure: 140 mmHg     Is BP treated: No     HDL Cholesterol: 49 mg/dL     Total Cholesterol: 187 mg/dL Repeat LP given low risk

## 2022-01-14 NOTE — Assessment & Plan Note (Signed)
Goal <140/<90 Defer second blood pressure check Continue to work on exercise and 5-10% weight loss to assist need for medication to control blood pressure

## 2022-01-15 LAB — COMPREHENSIVE METABOLIC PANEL
ALT: 26 IU/L (ref 0–32)
AST: 18 IU/L (ref 0–40)
Albumin/Globulin Ratio: 2 (ref 1.2–2.2)
Albumin: 4.5 g/dL (ref 3.8–4.9)
Alkaline Phosphatase: 77 IU/L (ref 44–121)
BUN/Creatinine Ratio: 16 (ref 9–23)
BUN: 13 mg/dL (ref 6–24)
Bilirubin Total: 0.2 mg/dL (ref 0.0–1.2)
CO2: 23 mmol/L (ref 20–29)
Calcium: 9.3 mg/dL (ref 8.7–10.2)
Chloride: 103 mmol/L (ref 96–106)
Creatinine, Ser: 0.81 mg/dL (ref 0.57–1.00)
Globulin, Total: 2.2 g/dL (ref 1.5–4.5)
Glucose: 91 mg/dL (ref 70–99)
Potassium: 4 mmol/L (ref 3.5–5.2)
Sodium: 141 mmol/L (ref 134–144)
Total Protein: 6.7 g/dL (ref 6.0–8.5)
eGFR: 87 mL/min/{1.73_m2} (ref >59)

## 2022-01-15 LAB — TSH+FREE T4
Free T4: 1.21 ng/dL (ref 0.82–1.77)
TSH: 2.62 u[IU]/mL (ref 0.450–4.500)

## 2022-01-15 LAB — CBC WITH DIFFERENTIAL/PLATELET
Basophils Absolute: 0 10*3/uL (ref 0.0–0.2)
Basos: 0 %
EOS (ABSOLUTE): 0.1 10*3/uL (ref 0.0–0.4)
Eos: 1 %
Hematocrit: 42.8 % (ref 34.0–46.6)
Hemoglobin: 13.9 g/dL (ref 11.1–15.9)
Immature Grans (Abs): 0 10*3/uL (ref 0.0–0.1)
Immature Granulocytes: 0 %
Lymphocytes Absolute: 1.7 10*3/uL (ref 0.7–3.1)
Lymphs: 18 %
MCH: 27.3 pg (ref 26.6–33.0)
MCHC: 32.5 g/dL (ref 31.5–35.7)
MCV: 84 fL (ref 79–97)
Monocytes Absolute: 0.5 10*3/uL (ref 0.1–0.9)
Monocytes: 5 %
Neutrophils Absolute: 7 10*3/uL (ref 1.4–7.0)
Neutrophils: 76 %
Platelets: 241 10*3/uL (ref 150–450)
RBC: 5.09 x10E6/uL (ref 3.77–5.28)
RDW: 13.8 % (ref 11.7–15.4)
WBC: 9.3 10*3/uL (ref 3.4–10.8)

## 2022-01-15 LAB — LIPID PANEL
Chol/HDL Ratio: 3.3 ratio (ref 0.0–4.4)
Cholesterol, Total: 195 mg/dL (ref 100–199)
HDL: 59 mg/dL (ref >39)
LDL Chol Calc (NIH): 112 mg/dL — ABNORMAL HIGH (ref 0–99)
Triglycerides: 136 mg/dL (ref 0–149)
VLDL Cholesterol Cal: 24 mg/dL (ref 5–40)

## 2022-01-15 LAB — HEMOGLOBIN A1C
Est. average glucose Bld gHb Est-mCnc: 120 mg/dL
Hgb A1c MFr Bld: 5.8 % — ABNORMAL HIGH (ref 4.8–5.6)

## 2022-01-19 ENCOUNTER — Encounter: Payer: Self-pay | Admitting: Family Medicine

## 2022-01-19 NOTE — Progress Notes (Signed)
Labs relatively stable; elevation returned in A1c to show pre-diabetic levels. Continue to recommend balanced, lower carb meals. Smaller meal size, adding snacks. Choosing water as drink of choice and increasing purposeful exercise.  LDL/bad cholesterol remains elevated. Heart attack and stroke risk estimates remain low. The 10-year ASCVD risk score (Arnett DK, et al., 2019) is: 1.6%   Values used to calculate the score:     Age: 51 years     Sex: Female     Is Non-Hispanic African American: No     Diabetic: No     Tobacco smoker: No     Systolic Blood Pressure: 140 mmHg     Is BP treated: No     HDL Cholesterol: 59 mg/dL     Total Cholesterol: 195 mg/dL

## 2022-03-23 ENCOUNTER — Telehealth: Payer: BC Managed Care – PPO | Admitting: Nurse Practitioner

## 2022-03-23 DIAGNOSIS — R051 Acute cough: Secondary | ICD-10-CM

## 2022-03-23 DIAGNOSIS — J029 Acute pharyngitis, unspecified: Secondary | ICD-10-CM | POA: Diagnosis not present

## 2022-03-23 MED ORDER — BENZONATATE 100 MG PO CAPS: 100.0000 mg | ORAL_CAPSULE | Freq: Three times a day (TID) | ORAL | 0 refills | Status: DC | PRN

## 2022-03-23 NOTE — Progress Notes (Signed)
E-Visit for Sore Throat  We are sorry that you are not feeling well.  Here is how we plan to help!  Your symptoms indicate a likely viral infection (Pharyngitis).   Pharyngitis is inflammation in the back of the throat which can cause a sore throat, scratchiness and sometimes difficulty swallowing.   Pharyngitis is typically caused by a respiratory virus and will just run its course.    Typically when a sore throat is associated with a cough it is from a virus causing both symptoms and resolving the cough will help resolve the sore throat.   We recommend Cepacol lozenges, warm salt water gargles and a cough medication. You can either continue to use the medicine you have over the counter or switch to the prescription medication we will send in:  Meds ordered this encounter  Medications   benzonatate (TESSALON) 100 MG capsule    Sig: Take 1 capsule (100 mg total) by mouth 3 (three) times daily as needed.    Dispense:  30 capsule    Refill:  0       Please keep in mind that your symptoms could last up to 10 days.  For throat pain, we recommend over the counter oral pain relief medications such as acetaminophen or aspirin, or anti-inflammatory medications such as ibuprofen or naproxen sodium.  Topical treatments such as oral throat lozenges or sprays may be used as needed.  Avoid close contact with loved ones, especially the very young and elderly.  Remember to wash your hands thoroughly throughout the day as this is the number one way to prevent the spread of infection and wipe down door knobs and counters with disinfectant.  After careful review of your answers, I would not recommend an antibiotic for your condition.  Antibiotics should not be used to treat conditions that we suspect are caused by viruses like the virus that causes the common cold or flu. However, some people can have Strep with atypical symptoms. You may need formal testing in clinic or office to confirm if your symptoms  continue or worsen.  Providers prescribe antibiotics to treat infections caused by bacteria. Antibiotics are very powerful in treating bacterial infections when they are used properly.  To maintain their effectiveness, they should be used only when necessary.  Overuse of antibiotics has resulted in the development of super bugs that are resistant to treatment!    Home Care: Only take medications as instructed by your medical team. Do not drink alcohol while taking these medications. A steam or ultrasonic humidifier can help congestion.  You can place a towel over your head and breathe in the steam from hot water coming from a faucet. Avoid close contacts especially the very young and the elderly. Cover your mouth when you cough or sneeze. Always remember to wash your hands.  Get Help Right Away If: You develop worsening fever or throat pain. You develop a severe head ache or visual changes. Your symptoms persist after you have completed your treatment plan.  Make sure you Understand these instructions. Will watch your condition. Will get help right away if you are not doing well or get worse.   Thank you for choosing an e-visit.  Your e-visit answers were reviewed by a board certified advanced clinical practitioner to complete your personal care plan. Depending upon the condition, your plan could have included both over the counter or prescription medications.  Please review your pharmacy choice. Make sure the pharmacy is open so you can pick  up prescription now. If there is a problem, you may contact your provider through [employer] and have the prescription routed to another pharmacy.  Your safety is important to [place]. If you have drug allergies check your prescription carefully.   For the next 24 hours you can use MyChart to ask questions about today's visit, request a non-urgent call back, or ask for a work or school excuse. You will get an email in the next two days asking  about your experience. I hope that your e-visit has been valuable and will speed your recovery.   I spent approximately 5 minutes reviewing the patient's history, current symptoms and coordinating their care today.

## 2022-12-07 LAB — HM DIABETES EYE EXAM

## 2023-02-14 ENCOUNTER — Other Ambulatory Visit (HOSPITAL_COMMUNITY)
Admission: RE | Admit: 2023-02-14 | Discharge: 2023-02-14 | Disposition: A | Discharge status: Home / Self Care | Payer: BC Managed Care – PPO | Source: Ambulatory Visit | Attending: Family Medicine | Admitting: Family Medicine

## 2023-02-14 ENCOUNTER — Encounter: Payer: Self-pay | Admitting: Family Medicine

## 2023-02-14 ENCOUNTER — Ambulatory Visit: Payer: BC Managed Care – PPO | Admitting: Family Medicine

## 2023-02-14 VITALS — BP 136/78 | HR 77 | Ht 62.0 in | Wt 255.0 lb

## 2023-02-14 DIAGNOSIS — R7303 Prediabetes: Secondary | ICD-10-CM | POA: Diagnosis not present

## 2023-02-14 DIAGNOSIS — Z0000 Encounter for general adult medical examination without abnormal findings: Secondary | ICD-10-CM

## 2023-02-14 DIAGNOSIS — Z0001 Encounter for general adult medical examination with abnormal findings: Secondary | ICD-10-CM | POA: Diagnosis not present

## 2023-02-14 DIAGNOSIS — Z1231 Encounter for screening mammogram for malignant neoplasm of breast: Secondary | ICD-10-CM

## 2023-02-14 DIAGNOSIS — Z124 Encounter for screening for malignant neoplasm of cervix: Secondary | ICD-10-CM | POA: Insufficient documentation

## 2023-02-14 DIAGNOSIS — E7800 Pure hypercholesterolemia, unspecified: Secondary | ICD-10-CM

## 2023-02-14 DIAGNOSIS — N951 Menopausal and female climacteric states: Secondary | ICD-10-CM

## 2023-02-14 DIAGNOSIS — I10 Essential (primary) hypertension: Secondary | ICD-10-CM

## 2023-02-14 DIAGNOSIS — Z23 Encounter for immunization: Secondary | ICD-10-CM | POA: Diagnosis not present

## 2023-02-14 NOTE — Assessment & Plan Note (Signed)
Chronic, improved Down 20# Body mass index is 46.64 kg/m. Discussed importance of healthy weight management Discussed diet and exercise

## 2023-02-14 NOTE — Progress Notes (Signed)
Complete physical exam   Patient: Heather Hester   DOB: [birth year]   52 y.o. Female  MRN: [id number] Visit Date: 02/14/2023  Today's healthcare provider: Jacky Kindle, FNP  Introduced to nurse practitioner role and practice setting.  All questions answered.  Discussed provider/patient relationship and expectations.  Chief Complaint  Patient presents with   Annual Exam   Subjective    Heather Hester is a 52 y.o. female who presents today for a complete physical exam.  She reports consuming a diabetic, low calorie diet. Home exercise routine includes walking  1/2 hrs per 3 days a week and rowing 15 mins 2x/week. She generally feels fairly well. She reports sleeping fairly well. She does have additional problems to discuss today.   HPI HPI   papsmear Last edited by Shelly Bombard, CMA on 02/14/2023  4:07 PM.      History reviewed. No pertinent past medical history. Past Surgical History:  Procedure Laterality Date   BREAST BIOPSY Right 09/23/2007   core bx with clip   BREAST BIOPSY Right 09/05/2017   neg   CARPAL TUNNEL RELEASE Left 2010   Social History   Socioeconomic History   Marital status: Single    Spouse name: Not on file   Number of children: 0   Years of education: HS   Highest education level: Some college, no degree  Occupational History   Occupation: Air cabin crew    Comment: Full-Time  Tobacco Use   Smoking status: Never   Smokeless tobacco: Never  Substance and Sexual Activity   Alcohol use: No    Alcohol/week: 0.0 standard drinks of alcohol   Drug use: No   Sexual activity: Not on file  Other Topics Concern   Not on file  Social History Narrative   Not on file   Social Determinants of Health   Financial Resource Strain: Low Risk  (02/13/2023)   Overall Financial Resource Strain (CARDIA)    Difficulty of Paying Living Expenses: Not hard at all  Food Insecurity: No Food Insecurity (02/13/2023)   Hunger Vital Sign     Worried About Running Out of Food in the Last Year: Never true    Ran Out of Food in the Last Year: Never true  Transportation Needs: No Transportation Needs (02/13/2023)   PRAPARE - Administrator, Civil Service (Medical): No    Lack of Transportation (Non-Medical): No  Physical Activity: Sufficiently Active (02/13/2023)   Exercise Vital Sign    Days of Exercise per Week: 5 days    Minutes of Exercise per Session: 30 min  Stress: No Stress Concern Present (02/13/2023)   [employer] of Occupational Health - Occupational Stress Questionnaire    Feeling of Stress : Only a little  Social Connections: Moderately Integrated (02/13/2023)   Social Connection and Isolation Panel [NHANES]    Frequency of Communication with Friends and Family: Once a week    Frequency of Social Gatherings with Friends and Family: Twice a week    Attends Religious Services: More than 4 times per year    Active Member of [employer] or Organizations: Yes    Attends Engineer, structural: More than 4 times per year    Marital Status: Never married  Intimate Partner Violence: Not on file   Family Status  Relation Name Status   Mother  Alive   Father  Deceased   Brother  Alive   Neg Hx  (Not Specified)  No partnership data on file   Family History  Problem Relation Age of Onset   COPD Mother    Depression Mother    Kidney disease Father    Hypertension Brother    Sleep apnea Brother    Breast cancer Neg Hx    No Known Allergies  Patient Care Team: Jacky Kindle, FNP as PCP - General (Family Medicine)   Medications: Outpatient Medications Prior to Visit  Medication Sig   [DISCONTINUED] benzonatate (TESSALON) 100 MG capsule Take 1 capsule (100 mg total) by mouth 3 (three) times daily as needed.   [DISCONTINUED] Multiple Vitamin (MULTI-VITAMINS PO) Take by mouth. (Patient not taking: Reported on 02/14/2023)   No facility-administered medications prior to visit.   Last CBC Lab  Results  Component Value Date   WBC 9.3 01/14/2022   HGB 13.9 01/14/2022   HCT 42.8 01/14/2022   MCV 84 01/14/2022   MCH 27.3 01/14/2022   RDW 13.8 01/14/2022   PLT 241 01/14/2022   Last metabolic panel Lab Results  Component Value Date   GLUCOSE 91 01/14/2022   NA 141 01/14/2022   K 4.0 01/14/2022   CL 103 01/14/2022   CO2 23 01/14/2022   BUN 13 01/14/2022   CREATININE 0.81 01/14/2022   EGFR 87 01/14/2022   CALCIUM 9.3 01/14/2022   PROT 6.7 01/14/2022   ALBUMIN 4.5 01/14/2022   LABGLOB 2.2 01/14/2022   AGRATIO 2.0 01/14/2022   BILITOT 0.2 01/14/2022   ALKPHOS 77 01/14/2022   AST 18 01/14/2022   ALT 26 01/14/2022   ANIONGAP 6 02/25/2015   Last lipids Lab Results  Component Value Date   CHOL 195 01/14/2022   HDL 59 01/14/2022   LDLCALC 112 (H) 01/14/2022   TRIG 136 01/14/2022   CHOLHDL 3.3 01/14/2022   Last hemoglobin A1c Lab Results  Component Value Date   HGBA1C 5.8 (H) 01/14/2022   Last thyroid functions Lab Results  Component Value Date   TSH 2.620 01/14/2022   Last vitamin D Lab Results  Component Value Date   VD25OH 39.1 05/25/2016   Last vitamin B12 and Folate Lab Results  Component Value Date   VITAMINB12 352 09/09/2015   FOLATE 7.2 09/09/2015    Objective    BP 136/78 (BP Location: Right Arm, Patient Position: Sitting, Cuff Size: Large)   Pulse 77   Ht 5\' 2"  (1.575 m)   Wt 255 lb (115.7 kg)   SpO2 97%   BMI 46.64 kg/m   BP Readings from Last 3 Encounters:  02/14/23 136/78  01/14/22 (!) 140/92  01/06/21 131/87   Wt Readings from Last 3 Encounters:  02/14/23 255 lb (115.7 kg)  01/14/22 274 lb (124.3 kg)  01/06/21 275 lb 6.4 oz (124.9 kg)   SpO2 Readings from Last 3 Encounters:  02/14/23 97%  01/14/22 98%  01/06/21 96%   Physical Exam Vitals and nursing note reviewed.  Constitutional:      General: She is awake. She is not in acute distress.    Appearance: Normal appearance. She is well-developed and well-groomed. She is  obese. She is not ill-appearing, toxic-appearing or diaphoretic.  HENT:     Head: Normocephalic and atraumatic.     Jaw: There is normal jaw occlusion. No trismus, tenderness, swelling or pain on movement.     Right Ear: Hearing, tympanic membrane, ear canal and external ear normal. There is no impacted cerumen.     Left Ear: Hearing, tympanic membrane, ear canal and external ear normal.  There is no impacted cerumen.     Nose: Nose normal. No congestion or rhinorrhea.     Right Turbinates: Not enlarged, swollen or pale.     Left Turbinates: Not enlarged, swollen or pale.     Right Sinus: No maxillary sinus tenderness or frontal sinus tenderness.     Left Sinus: No maxillary sinus tenderness or frontal sinus tenderness.     Mouth/Throat:     Lips: Pink.     Mouth: Mucous membranes are moist. No injury.     Tongue: No lesions.     Pharynx: Oropharynx is clear. Uvula midline. No pharyngeal swelling, oropharyngeal exudate, posterior oropharyngeal erythema or uvula swelling.     Tonsils: No tonsillar exudate or tonsillar abscesses.  Eyes:     General: Lids are normal. Lids are everted, no foreign bodies appreciated. Vision grossly intact. Gaze aligned appropriately. No allergic shiner or visual field deficit.       Right eye: No discharge.        Left eye: No discharge.     Extraocular Movements: Extraocular movements intact.     Conjunctiva/sclera: Conjunctivae normal.     Right eye: Right conjunctiva is not injected. No exudate.    Left eye: Left conjunctiva is not injected. No exudate.    Pupils: Pupils are equal, round, and reactive to light.  Neck:     Thyroid: No thyroid mass, thyromegaly or thyroid tenderness.     Vascular: No carotid bruit.     Trachea: Trachea normal.  Cardiovascular:     Rate and Rhythm: Normal rate and regular rhythm.     Pulses: Normal pulses.          Carotid pulses are 2+ on the right side and 2+ on the left side.      Radial pulses are 2+ on the right side  and 2+ on the left side.       Dorsalis pedis pulses are 2+ on the right side and 2+ on the left side.       Posterior tibial pulses are 2+ on the right side and 2+ on the left side.     Heart sounds: Normal heart sounds, S1 normal and S2 normal. No murmur heard.    No friction rub. No gallop.  Pulmonary:     Effort: Pulmonary effort is normal. No respiratory distress.     Breath sounds: Normal breath sounds and air entry. No stridor. No wheezing, rhonchi or rales.  Chest:     Chest wall: No tenderness.     Comments: Breasts: breasts appear normal, no suspicious masses, no skin or nipple changes or axillary nodes, symmetric fibrous changes in both upper outer quadrants, right breast normal without mass, skin or nipple changes or axillary nodes, left breast normal without mass, skin or nipple changes or axillary nodes, surgical scars noted on R breast, risk and benefit of breast self-exam was discussed  Abdominal:     General: Abdomen is flat. Bowel sounds are normal. There is no distension.     Palpations: Abdomen is soft. There is no mass.     Tenderness: There is no abdominal tenderness. There is no right CVA tenderness, left CVA tenderness, guarding or rebound.     Hernia: No hernia is present.  Genitourinary:    Comments: PAP completed Musculoskeletal:        General: No swelling, tenderness, deformity or signs of injury. Normal range of motion.     Cervical back: Full passive range of  motion without pain, normal range of motion and neck supple. No edema, rigidity or tenderness. No muscular tenderness.     Right lower leg: No edema.     Left lower leg: No edema.  Lymphadenopathy:     Cervical: No cervical adenopathy.     Right cervical: No superficial, deep or posterior cervical adenopathy.    Left cervical: No superficial, deep or posterior cervical adenopathy.  Skin:    General: Skin is warm and dry.     Capillary Refill: Capillary refill takes less than 2 seconds.      Coloration: Skin is not jaundiced or pale.     Findings: No bruising, erythema, lesion or rash.  Neurological:     General: No focal deficit present.     Mental Status: She is alert and oriented to person, place, and time. Mental status is at baseline.     GCS: GCS eye subscore is 4. GCS verbal subscore is 5. GCS motor subscore is 6.     Sensory: Sensation is intact. No sensory deficit.     Motor: Motor function is intact. No weakness.     Coordination: Coordination is intact. Coordination normal.     Gait: Gait is intact. Gait normal.  Psychiatric:        Attention and Perception: Attention and perception normal.        Mood and Affect: Mood and affect normal.        Speech: Speech normal.        Behavior: Behavior normal. Behavior is cooperative.        Thought Content: Thought content normal.        Cognition and Memory: Cognition and memory normal.        Judgment: Judgment normal.     Last depression screening scores    02/14/2023    3:56 PM 01/14/2022    2:30 PM 12/06/2019    9:14 AM  PHQ 2/9 Scores  PHQ - 2 Score 1 0 1  PHQ- 9 Score 3 0 2   Last fall risk screening    02/14/2023    9:28 PM  Fall Risk   Falls in the past year? 0  Number falls in past yr: 0  Injury with Fall? 0   Last Audit-C alcohol use screening    02/13/2023    9:31 AM  Alcohol Use Disorder Test (AUDIT)  1. How often do you have a drink containing alcohol? 0  3. How often do you have six or more drinks on one occasion? 0   A score of 3 or more in women, and 4 or more in men indicates increased risk for alcohol abuse, EXCEPT if all of the points are from question 1   No results found for any visits on 02/14/23.  Assessment & Plan    Routine Health Maintenance and Physical Exam  Exercise Activities and Dietary recommendations  Goals      diet     Going to due formal diet.      Exercise 150 minutes per week (moderate activity)     Exercise 150 minutes per week (moderate activity)         Immunization History  Administered Date(s) Administered   Influenza, Seasonal, Injecte, Preservative Fre 02/14/2023   Influenza,inj,Quad PF,6+ Mos 11/28/2012, 02/06/2014, 11/24/2016, 12/06/2019, 01/14/2022   Tdap 11/28/2012, 02/14/2023   Zoster Recombinant(Shingrix) 01/14/2022, 02/14/2023    Health Maintenance  Topic Date Due   Cervical Cancer Screening (HPV/Pap Cotest)  05/27/2022  COVID-19 Vaccine (1 - 2023-24 season) Never done   Fecal DNA (Cologuard)  01/09/2023   MAMMOGRAM  02/17/2023   DTaP/Tdap/Td (3 - Td or Tdap) 02/13/2033   INFLUENZA VACCINE  Completed   Hepatitis C Screening  Completed   HIV Screening  Completed   Zoster Vaccines- Shingrix  Completed   HPV VACCINES  Aged Out    Discussed health benefits of physical activity, and encouraged her to engage in regular exercise appropriate for her age and condition.  Problem List Items Addressed This Visit       Cardiovascular and Mediastinum   Primary hypertension   Relevant Orders   CBC with Differential/Platelet   Comprehensive metabolic panel   TSH     Other   Annual physical exam - Primary    Things to do to keep yourself healthy  - Exercise at least 30-45 minutes a day, 3-4 days a week.  - Eat a low-fat diet with lots of fruits and vegetables, up to 7-9 servings per day.  - Seatbelts can save your life. Wear them always.  - Smoke detectors on every level of your home, check batteries every year.  - Eye Doctor - have an eye exam every 1-2 years  - Safe sex - if you may be exposed to STDs, use a condom.  - Alcohol -  If you drink, do it moderately, less than 2 drinks per day.  - Health Care Power of Attorney. Choose someone to speak for you if you are not able.  - Depression is common in our stressful world.If you're feeling down or losing interest in things you normally enjoy, please come in for a visit.  - Violence - If anyone is threatening or hurting you, please call immediately.       Relevant  Orders   CBC with Differential/Platelet   Comprehensive metabolic panel   Lipid panel   TSH   Elevated LDL cholesterol level    Repeat LP The 10-year ASCVD risk score (Arnett DK, et al., 2019) is: 1.7% I continue to recommend diet low in saturated fat and regular exercise - 30 min at least 5 times per week       Encounter for immunization   Relevant Orders   Flu vaccine trivalent PF, 6mos and older(Flulaval,Afluria,Fluarix,Fluzone) (Completed)   Morbid obesity (HCC)    Chronic, improved Down 20# Body mass index is 46.64 kg/m. Discussed importance of healthy weight management Discussed diet and exercise       Need for diphtheria-tetanus-pertussis (Tdap) vaccine   Relevant Orders   Tdap vaccine greater than or equal to 7yo IM (Completed)   Need for influenza vaccination   Need for prophylactic vaccination against single diseases   Relevant Orders   Zoster Recombinant (Shingrix ) (Completed)   Need for shingles vaccine   Prediabetes    Chronic, repeat A1c Continue to recommend balanced, lower carb meals. Smaller meal size, adding snacks. Choosing water as drink of choice and increasing purposeful exercise. Pt has had successful weight loss with use of 'diabetic diet'      Relevant Orders   Hemoglobin A1c   Screening for cervical cancer    PAP completed; no vaginal concerns      Relevant Orders   Cytology - PAP   Screening mammogram for breast cancer    Due for repeat mammo; not completed in 23 Hx of r breast clip s/p bx      Relevant Orders   MM 3D SCREENING MAMMOGRAM BILATERAL BREAST  Vasomotor symptoms due to menopause    Chronic, variable Continue to monitor use of OTC treatment options; advised LARC may be used to further assist if needed      Relevant Orders   VITAMIN D 25 Hydroxy (Vit-D Deficiency, Fractures)   Return in about 6 months (around 08/15/2023), or if symptoms worsen or fail to improve, for chonic disease management.    Leilani Merl,  FNP, have reviewed all documentation for this visit. The documentation on 02/14/23 for the exam, diagnosis, procedures, and orders are all accurate and complete.  Jacky Kindle, FNP  [facility] [phone] (phone) [phone] (fax)  [facility]

## 2023-02-14 NOTE — Patient Instructions (Signed)
Please call and schedule your mammogram:  [facility]  [address], [address] [facility] [place],  [place]  [address]  Main: [phone]

## 2023-02-14 NOTE — Assessment & Plan Note (Signed)
Due for repeat mammo; not completed in 23 Hx of r breast clip s/p bx

## 2023-02-14 NOTE — Assessment & Plan Note (Signed)
PAP completed; no vaginal concerns

## 2023-02-14 NOTE — Assessment & Plan Note (Signed)

## 2023-02-14 NOTE — Assessment & Plan Note (Signed)
Chronic, variable Continue to monitor use of OTC treatment options; advised LARC may be used to further assist if needed

## 2023-02-14 NOTE — Assessment & Plan Note (Signed)
Chronic, repeat A1c Continue to recommend balanced, lower carb meals. Smaller meal size, adding snacks. Choosing water as drink of choice and increasing purposeful exercise. Pt has had successful weight loss with use of 'diabetic diet'

## 2023-02-14 NOTE — Assessment & Plan Note (Signed)
Repeat LP The 10-year ASCVD risk score (Arnett DK, et al., 2019) is: 1.7% I continue to recommend diet low in saturated fat and regular exercise - 30 min at least 5 times per week

## 2023-02-16 LAB — CBC WITH DIFFERENTIAL/PLATELET
Basophils Absolute: 0 10*3/uL (ref 0.0–0.2)
Basos: 1 %
EOS (ABSOLUTE): 0.1 10*3/uL (ref 0.0–0.4)
Eos: 2 %
Hematocrit: 43.3 % (ref 34.0–46.6)
Hemoglobin: 13.6 g/dL (ref 11.1–15.9)
Immature Grans (Abs): 0 10*3/uL (ref 0.0–0.1)
Immature Granulocytes: 0 %
Lymphocytes Absolute: 1.6 10*3/uL (ref 0.7–3.1)
Lymphs: 20 %
MCH: 27.6 pg (ref 26.6–33.0)
MCHC: 31.4 g/dL — ABNORMAL LOW (ref 31.5–35.7)
MCV: 88 fL (ref 79–97)
Monocytes Absolute: 0.5 10*3/uL (ref 0.1–0.9)
Monocytes: 6 %
Neutrophils Absolute: 5.6 10*3/uL (ref 1.4–7.0)
Neutrophils: 71 %
Platelets: 218 10*3/uL (ref 150–450)
RBC: 4.93 x10E6/uL (ref 3.77–5.28)
RDW: 13.3 % (ref 11.7–15.4)
WBC: 7.8 10*3/uL (ref 3.4–10.8)

## 2023-02-16 LAB — HEMOGLOBIN A1C
Est. average glucose Bld gHb Est-mCnc: 114 mg/dL
Hgb A1c MFr Bld: 5.6 % (ref 4.8–5.6)

## 2023-02-16 LAB — COMPREHENSIVE METABOLIC PANEL
ALT: 15 [IU]/L (ref 0–32)
AST: 12 [IU]/L (ref 0–40)
Albumin: 4.3 g/dL (ref 3.8–4.9)
Alkaline Phosphatase: 89 [IU]/L (ref 44–121)
BUN/Creatinine Ratio: 18 (ref 9–23)
BUN: 15 mg/dL (ref 6–24)
Bilirubin Total: 0.3 mg/dL (ref 0.0–1.2)
CO2: 22 mmol/L (ref 20–29)
Calcium: 8.7 mg/dL (ref 8.7–10.2)
Chloride: 101 mmol/L (ref 96–106)
Creatinine, Ser: 0.85 mg/dL (ref 0.57–1.00)
Globulin, Total: 2.2 g/dL (ref 1.5–4.5)
Glucose: 97 mg/dL (ref 70–99)
Potassium: 4.5 mmol/L (ref 3.5–5.2)
Sodium: 139 mmol/L (ref 134–144)
Total Protein: 6.5 g/dL (ref 6.0–8.5)
eGFR: 82 mL/min/{1.73_m2} (ref >59)

## 2023-02-16 LAB — LIPID PANEL
Chol/HDL Ratio: 3.3 {ratio} (ref 0.0–4.4)
Cholesterol, Total: 170 mg/dL (ref 100–199)
HDL: 51 mg/dL (ref >39)
LDL Chol Calc (NIH): 101 mg/dL — ABNORMAL HIGH (ref 0–99)
Triglycerides: 101 mg/dL (ref 0–149)
VLDL Cholesterol Cal: 18 mg/dL (ref 5–40)

## 2023-02-16 LAB — TSH: TSH: 2.32 u[IU]/mL (ref 0.450–4.500)

## 2023-02-16 LAB — VITAMIN D 25 HYDROXY (VIT D DEFICIENCY, FRACTURES): Vit D, 25-Hydroxy: 23.8 ng/mL — ABNORMAL LOW (ref 30.0–100.0)

## 2023-02-17 LAB — CYTOLOGY - PAP
Chlamydia: NEGATIVE
Comment: NEGATIVE
Comment: NEGATIVE
Comment: NEGATIVE
Comment: NEGATIVE
Comment: NORMAL
Diagnosis: NEGATIVE
HSV1: NEGATIVE
HSV2: NEGATIVE
High risk HPV: NEGATIVE
Neisseria Gonorrhea: NEGATIVE
Trichomonas: NEGATIVE

## 2023-02-23 ENCOUNTER — Encounter: Payer: Self-pay | Admitting: Family Medicine

## 2023-02-23 DIAGNOSIS — E559 Vitamin D deficiency, unspecified: Secondary | ICD-10-CM

## 2023-02-23 DIAGNOSIS — Z1211 Encounter for screening for malignant neoplasm of colon: Secondary | ICD-10-CM

## 2023-03-07 MED ORDER — VITAMIN D (ERGOCALCIFEROL) 1.25 MG (50000 UNIT) PO CAPS: 50000.0000 [IU] | ORAL_CAPSULE | ORAL | 1 refills | Status: DC

## 2023-04-19 LAB — COLOGUARD: COLOGUARD: NEGATIVE

## 2023-04-24 ENCOUNTER — Encounter: Payer: Self-pay | Admitting: Family Medicine

## 2023-07-15 ENCOUNTER — Telehealth: Admitting: Nurse Practitioner

## 2023-07-15 DIAGNOSIS — L237 Allergic contact dermatitis due to plants, except food: Secondary | ICD-10-CM

## 2023-07-15 MED ORDER — DOXYCYCLINE HYCLATE 100 MG PO TABS: 100.0000 mg | ORAL_TABLET | Freq: Two times a day (BID) | ORAL | 0 refills | Status: AC

## 2023-07-15 MED ORDER — PREDNISONE 10 MG PO TABS: ORAL_TABLET | ORAL | 0 refills | Status: DC

## 2023-07-15 NOTE — Progress Notes (Signed)
E-Visit for Poison Ivy  We are sorry that you are not feeing well.  Here is how we plan to help!  Based on what you have shared with me it looks like you have had an allergic reaction to the oily resin from a group of plants.  This resin is very sticky, so it easily attaches to your skin, clothing, tools equipment, and pet's fur.    This blistering rash is often called poison ivy rash although it can come from contact with the leaves, stems and roots of poison ivy, poison oak and poison sumac.  The oily resin contains urushiol (u-ROO-she-ol) that produces a skin rash on exposed skin.  The severity of the rash depends on the amount of urushiol that gets on your skin.  A section of skin with more urushiol on it may develop a rash sooner.  The rash usually develops 12-48 hours after exposure and can last two to three weeks.  Your skin must come in direct contact with the plant's oil to be affected.  Blister fluid doesn't spread the rash.  However, if you come into contact with a piece of clothing or pet fur that has urushiol on it, the rash may spread out.  You can also transfer the oil to other parts of your body with your fingers.  Often the rash looks like a straight line because of the way the plant brushes against your skin.  I suspect that a bacterial infection has developed at the rash site, and I have given you both an oral steroid and an oral antibiotic.  I have sent a prednisone dose pack to your chosen pharmacy. Be sure to follow the instructions carefully and complete the entire prescription.  You may use Benadryl or Caladryl topical lotions to sooth the itch and remember cool, not hot, showers and baths can help relieve the itching!  I have also sent in a prescription for an antibiotic: doxycycline 100 mg twice a day for 10 days.   Please schedule an appointment with your primary care provider to follow up on your skin infection within 2 weeks.  If drainage and red streaking continue to spread  you should be seen urgently.  Make sure that the clothes you were wearing and any towels or sheets that may have come in contact with the oil (urushiol) are washed in detergent and hot water.    I have developed the following plan to treat your condition I am prescribing a two week course of steroids (37 tablets of 10 mg prednisone).  Days 1-4 take 4 tablets (40 mg) daily  Days 5-8 take 3 tablets (30 mg) daily, Days 9-11 take 2 tablets (20 mg) daily, Days 12-14 take 1 tablet (10 mg) daily.  and I am prescribing Doxycycline 100 mg twice per day with food for ten days (20 tablets)   What can you do to prevent this rash?  Avoid the plants.  Learn how to identify poison ivy, poison oak and poison sumac in all seasons.  When hiking or engaging in other activities that might expose you to these plants, try to stay on cleared pathways.  If camping, make sure you pitch your tent in an area free of these plants.  Keep pets from running through wooded areas so that urushiol doesn't accidentally stick to their fur, which you may touch.  Remove or kill the plants.  In your yard, you can get rid of poison ivy by applying an herbicide or pulling it out   of the ground, including the roots, while wearing heavy gloves.  Afterward remove the gloves and thoroughly wash them and your hands.  Don't burn poison ivy or related plants because the urushiol can be carried by smoke.  Wear protective clothing.  If needed, protect your skin by wearing socks, boots, pants, long sleeves and vinyl gloves.  Wash your skin right away.  Washing off the oil with soap and water within 30 minutes of exposure may reduce your chances of getting a poison ivy rash.  Even washing after an hour or so can help reduce the severity of the rash.  If you walk through some poison ivy and then later touch your shoes, you may get some urushiol on your hands, which may then transfer to your face or body by touching or rubbing.  If the contaminated object  isn't cleaned, the urushiol on it can still cause a skin reaction years later.    Be careful not to reuse towels after you have washed your skin.  Also carefully wash clothing in detergent and hot water to remove all traces of the oil.  Handle contaminated clothing carefully so you don't transfer the urushiol to yourself, furniture, rugs or appliances.  Remember that pets can carry the oil on their fur and paws.  If you think your pet may be contaminated with urushiol, put on some long rubber gloves and give your pet a bath.  Finally, be careful not to burn these plants as the smoke can contain traces of the oil.  Inhaling the smoke may result in difficulty breathing. If that occurred you should see a physician as soon as possible.  See your doctor right away if:  The reaction is severe or widespread You inhaled the smoke from burning poison ivy and are having difficulty breathing Your skin continues to swell The rash affects your eyes, mouth or genitals Blisters are oozing pus You develop a fever greater than 100 F (37.8 C) The rash doesn't get better within a few weeks.  If you scratch the poison ivy rash, bacteria under your fingernails may cause the skin to become infected.  See your doctor if pus starts oozing from the blisters.  Treatment generally includes antibiotics.  Poison ivy treatments are usually limited to self-care methods.  And the rash typically goes away on its own in two to three weeks.     If the rash is widespread or results in a large number of blisters, your doctor may prescribe an oral corticosteroid, such as prednisone.  If a bacterial infection has developed at the rash site, your doctor may give you a prescription for an oral antibiotic.  MAKE SURE YOU  Understand these instructions. Will watch your condition. Will get help right away if you are not doing well or get worse.   Thank you for choosing an e-visit.  Your e-visit answers were reviewed by a  board certified advanced clinical practitioner to complete your personal care plan. Depending upon the condition, your plan could have included both over the counter or prescription medications.  Please review your pharmacy choice. Make sure the pharmacy is open so you can pick up prescription now. If there is a problem, you may contact your provider through MyChart messaging and have the prescription routed to another pharmacy.  Your safety is important to us. If you have drug allergies check your prescription carefully.   For the next 24 hours you can use MyChart to ask questions about today's visit, request a   non-urgent call back, or ask for a work or school excuse. You will get an email in the next two days asking about your experience. I hope that your e-visit has been valuable and will speed your recovery.    

## 2023-07-15 NOTE — Progress Notes (Signed)
 I have spent 5 minutes in review of e-visit questionnaire, review and updating patient chart, medical decision making and response to patient.   Claiborne Rigg, NP

## 2024-02-15 ENCOUNTER — Encounter: Payer: Self-pay | Admitting: Family Medicine

## 2024-02-19 ENCOUNTER — Encounter: Payer: Self-pay | Admitting: Family Medicine

## 2024-02-19 ENCOUNTER — Ambulatory Visit: Admitting: Family Medicine

## 2024-02-19 VITALS — BP 126/69 | HR 96 | Temp 97.9°F | Ht 62.0 in | Wt 271.6 lb

## 2024-02-19 DIAGNOSIS — R7303 Prediabetes: Secondary | ICD-10-CM | POA: Diagnosis not present

## 2024-02-19 DIAGNOSIS — N182 Chronic kidney disease, stage 2 (mild): Secondary | ICD-10-CM

## 2024-02-19 DIAGNOSIS — Z0000 Encounter for general adult medical examination without abnormal findings: Secondary | ICD-10-CM

## 2024-02-19 DIAGNOSIS — Z1231 Encounter for screening mammogram for malignant neoplasm of breast: Secondary | ICD-10-CM | POA: Diagnosis not present

## 2024-02-19 DIAGNOSIS — Z23 Encounter for immunization: Secondary | ICD-10-CM | POA: Diagnosis not present

## 2024-02-19 DIAGNOSIS — E7800 Pure hypercholesterolemia, unspecified: Secondary | ICD-10-CM | POA: Diagnosis not present

## 2024-02-19 DIAGNOSIS — E559 Vitamin D deficiency, unspecified: Secondary | ICD-10-CM | POA: Diagnosis not present

## 2024-02-19 DIAGNOSIS — I10 Essential (primary) hypertension: Secondary | ICD-10-CM

## 2024-02-19 NOTE — Progress Notes (Signed)
 "    Complete physical exam   Patient: Heather Hester   DOB: [birth year]   53 y.o. Female  MRN: [id number] Visit Date: 02/19/2024  Today's healthcare provider: LAURAINE LOISE BUOY, DO   Chief Complaint  Patient presents with   Annual Exam    Diet- Monitors her sugar intake Exercise- Try to do a step exercise Overall feeling- Good Sleep- Pretty good Concerns- Trying to prevent being diabetic, also wants to be healthy  Flu vaccine- yes Other vaccines would like to discuss with provider.    Subjective    Heather Hester is a 53 y.o. female who presents today for a complete physical exam.  HPI     Annual Exam    Additional comments: Diet- Monitors her sugar intake Exercise- Try to do a step exercise Overall feeling- Good Sleep- Pretty good Concerns- Trying to prevent being diabetic, also wants to be healthy  Flu vaccine- yes Other vaccines would like to discuss with provider.       Last edited by Terrel Powell CROME, CMA on 02/19/2024  3:02 PM.       Heather Hester is a 53 year old female who presents for a routine follow-up and vaccination update.  She has a history of prediabetes and is actively managing her diet to prevent progression to diabetes. She has not been sick this year, which she considers a positive sign regarding her blood sugar control. She is not  currently on any medications and hopes to maintain this status.   She is experiencing significant changes related to perimenopause, including a seven-month absence of menstruation followed by a heavy period requiring incontinence pads. She has not experienced hot flashes, only occasional facial warmth. Her mother had a similar experience.  She mentions mobility issues, particularly after physical activities like mowing the lawn, which result in significant discomfort the following day. She engages in step aerobics and is interested in shuffle dancing for exercise. She lives alone and emphasizes the importance of maintaining her mobility.  She experiences occasional heartburn, particularly after consuming spicy foods, which she feels in her chest area. She has no history of chest pain or shortness of breath unrelated to dietary causes.  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 She has previously received the shingles vaccine, which she found painful.  She is mindful of her diet, focusing on non-processed foods and incorporating flaxseed for fiber. She makes her own flaxseed crackers to avoid store-bought options, which she finds unhealthy and expensive.  She has a family history of kidney issues, as her father required dialysis, possibly due to prolonged ibuprofen use. She occasionally takes Aleve for headaches, which she attributes to dehydration, and is increasing her water intake.    History reviewed. No pertinent past medical history. Past Surgical History:  Procedure Laterality Date   BREAST BIOPSY Right 09/23/2007   core bx with clip   BREAST BIOPSY Right 09/05/2017   neg   CARPAL TUNNEL RELEASE Left 2010   Social History   Socioeconomic History   Marital status: Single    Spouse name: Not on file   Number of children: 0   Years of education: HS   Highest education level: Some college,  no degree  Occupational History   Occupation: [employer]    Comment: Full-Time  Tobacco Use   Smoking status: Never   Smokeless tobacco: Never  Substance and Sexual Activity   Alcohol use: No    Alcohol/week: 0.0 standard drinks of alcohol   Drug use: No   Sexual activity: Not on file  Other Topics Concern   Not on file  Social History Narrative   Not on file   Social Drivers of Health   Tobacco Use: Low Risk (02/19/2024)   Patient History    Smoking Tobacco Use: Never    Smokeless Tobacco Use: Never    Passive Exposure: Not on file  Financial Resource Strain: Low Risk (02/18/2024)   Overall Financial Resource Strain (CARDIA)    Difficulty of Paying Living Expenses: Not very hard  Food Insecurity: No Food  Insecurity (02/18/2024)   Epic    Worried About Programme Researcher, Broadcasting/film/video in the Last Year: Never true    Ran Out of Food in the Last Year: Never true  Transportation Needs: No Transportation Needs (02/18/2024)   Epic    Lack of Transportation (Medical): No    Lack of Transportation (Non-Medical): No  Physical Activity: Sufficiently Active (02/13/2023)   Exercise Vital Sign    Days of Exercise per Week: 5 days    Minutes of Exercise per Session: 30 min  Stress: No Stress Concern Present (02/18/2024)   [employer] of Occupational Health - Occupational Stress Questionnaire    Feeling of Stress: Not at all  Social Connections: Socially Isolated (02/18/2024)   Social Connection and Isolation Panel    Frequency of Communication with Friends and Family: Once a week    Frequency of Social Gatherings with Friends and Family: Once a week    Attends Religious Services: Never    Database Administrator or Organizations: No    Attends Engineer, Structural: Not on file    Marital Status: Never married  Intimate Partner Violence: Not on file  Depression (PHQ2-9): Low Risk (02/19/2024)   Depression (PHQ2-9)    PHQ-2 Score: 1  Alcohol Screen: Low Risk  (02/18/2024)   Alcohol Screen    Last Alcohol Screening Score (AUDIT): 1  Housing: Low Risk (02/18/2024)   Epic    Unable to Pay for Housing in the Last Year: No    Number of Times Moved in the Last Year: 0    Homeless in the Last Year: No  Utilities: Not on file  Health Literacy: Not on file   Family Status  Relation Name Status   Mother Angelic Schnelle Alive   Father Aquita Simmering Deceased   Brother Mark Grunow Alive   Neg Hx  (Not Specified)  No partnership data on file   Family History  Problem Relation Age of Onset   COPD Mother    Depression Mother    Arthritis Mother    Obesity Mother    Kidney disease Father    Hypertension Father    Hypertension Brother    Sleep apnea Brother    Breast cancer Neg Hx    Allergies[1]  Patient Care Team: Jamesina Gaugh, Lauraine SAILOR, DO as PCP - General (Family Medicine)   Medications: Show/hide medication list[2]  Review of Systems  Constitutional:  Negative for chills, fatigue and fever.  HENT:  Negative for congestion, ear pain, rhinorrhea, sneezing and sore throat.   Eyes: Negative.  Negative for pain and redness.  Respiratory:  Negative for cough, shortness of breath and wheezing.   Cardiovascular:  Negative for chest pain and leg swelling.  Gastrointestinal:  Negative for abdominal pain, blood in stool, constipation, diarrhea and nausea.  Endocrine: Negative for polydipsia and polyphagia.  Genitourinary: Negative.  Negative for dysuria, flank pain, hematuria, pelvic pain, vaginal bleeding and vaginal discharge.  Musculoskeletal:  Negative for arthralgias, back pain, gait problem and joint swelling.  Skin:  Negative for rash.  Neurological: Negative.  Negative for dizziness, tremors, seizures, weakness, light-headedness, numbness and headaches.  Hematological:  Negative for adenopathy.  Psychiatric/Behavioral: Negative.  Negative for behavioral problems, confusion and dysphoric mood. The patient is not nervous/anxious and is not  hyperactive.       Objective    BP 126/69 (BP Location: Left Arm, Patient Position: Sitting, Cuff Size: Large)   Pulse 96   Temp 97.9 F (36.6 C) (Oral)  Ht 5' 2 (1.575 m)   Wt 271 lb 9.6 oz (123.2 kg)   SpO2 100%   BMI 49.68 kg/m    Physical Exam Vitals reviewed.  Constitutional:      General: She is not in acute distress.    Appearance: Normal appearance. She is well-developed. She is morbidly obese. She is not diaphoretic.  HENT:     Head: Normocephalic and atraumatic.     Right Ear: Tympanic membrane, ear canal and external ear normal.     Left Ear: Tympanic membrane, ear canal and external ear normal.     Nose: Nose normal.     Mouth/Throat:     Mouth: Mucous membranes are moist.     Pharynx: Oropharynx is clear. No oropharyngeal exudate.  Eyes:     General: No scleral icterus.    Conjunctiva/sclera: Conjunctivae normal.     Pupils: Pupils are equal, round, and reactive to light.  Neck:     Thyroid: No thyromegaly.  Cardiovascular:     Rate and Rhythm: Normal rate and regular rhythm.     Pulses: Normal pulses.     Heart sounds: Normal heart sounds. No murmur heard. Pulmonary:     Effort: Pulmonary effort is normal. No respiratory distress.     Breath sounds: Normal breath sounds. No wheezing or rales.  Abdominal:     General: There is no distension.     Palpations: Abdomen is soft.     Tenderness: There is no abdominal tenderness.  Musculoskeletal:        General: No deformity.     Cervical back: Neck supple.     Right lower leg: No edema.     Left lower leg: No edema.  Lymphadenopathy:     Cervical: No cervical adenopathy.  Skin:    General: Skin is warm and dry.     Findings: No rash.  Neurological:     Mental Status: She is alert and oriented to person, place, and time. Mental status is at baseline.     Gait: Gait normal.  Psychiatric:        Mood and Affect: Mood normal.        Behavior: Behavior normal.        Thought Content: Thought content  normal.      Last depression screening scores    02/19/2024    3:19 PM 02/14/2023    3:56 PM 01/14/2022    2:30 PM  PHQ 2/9 Scores  PHQ - 2 Score 0 1 0  PHQ- 9 Score 1 3  0      Data saved with a previous flowsheet row definition   Last fall risk screening    02/19/2024    3:19 PM  Fall Risk   Falls in the past year? 0  Number falls in past yr: 0  Injury with Fall? 0  Risk for fall due to : No Fall Risks   Last Audit-C alcohol use screening    02/18/2024    7:38 PM  Alcohol Use Disorder Test (AUDIT)  1. How often do you have a drink containing alcohol? 1   2. How many drinks containing alcohol do you have on a typical day when you are drinking? 0   3. How often do you have six or more drinks on one occasion? 0   AUDIT-C Score 1      Manually entered by patient   A score of 3 or more in women, and 4 or more in  men indicates increased risk for alcohol abuse, EXCEPT if all of the points are from question 1   Results for orders placed or performed in visit on 02/19/24  Hemoglobin A1c  Result Value Ref Range   Hgb A1c MFr Bld 5.6 4.8 - 5.6 %   Est. average glucose Bld gHb Est-mCnc 114 mg/dL  Vitamin D  (25 hydroxy)  Result Value Ref Range   Vit D, 25-Hydroxy 28.7 (L) 30.0 - 100.0 ng/mL  Lipid panel  Result Value Ref Range   Cholesterol, Total 171 100 - 199 mg/dL   Triglycerides 885 0 - 149 mg/dL   HDL 65 >60 mg/dL   VLDL Cholesterol Cal 20 5 - 40 mg/dL   LDL Chol Calc (NIH) 86 0 - 99 mg/dL   Chol/HDL Ratio 2.6 0.0 - 4.4 ratio  Comprehensive metabolic panel with GFR  Result Value Ref Range   Glucose 85 70 - 99 mg/dL   BUN 14 6 - 24 mg/dL   Creatinine, Ser 9.24 0.57 - 1.00 mg/dL   eGFR 95 >40 fO/fpw/8.26   BUN/Creatinine Ratio 19 9 - 23   Sodium 144 134 - 144 mmol/L   Potassium 4.4 3.5 - 5.2 mmol/L   Chloride 104 96 - 106 mmol/L   CO2 21 20 - 29 mmol/L   Calcium 9.1 8.7 - 10.2 mg/dL   Total Protein 6.7 6.0 - 8.5 g/dL   Albumin 4.4 3.8 - 4.9 g/dL    Globulin, Total 2.3 1.5 - 4.5 g/dL   Bilirubin Total 0.4 0.0 - 1.2 mg/dL   Alkaline Phosphatase 75 49 - 135 IU/L   AST 16 0 - 40 IU/L   ALT 17 0 - 32 IU/L  Urine Microalbumin w/creat. ratio  Result Value Ref Range   Creatinine, Urine 54.0 Not Estab. mg/dL   Microalbumin, Urine 73.6 Not Estab. ug/mL   Microalb/Creat Ratio 49 (H) 0 - 29 mg/g creat    Assessment & Plan    Routine Health Maintenance and Physical Exam  Exercise Activities and Dietary recommendations  Goals      diet     Going to due formal diet.      Exercise 150 minutes per week (moderate activity)     Exercise 150 minutes per week (moderate activity)        Immunization History  Administered Date(s) Administered   Hepb-cpg 02/19/2024   Influenza, Seasonal, Injecte, Preservative Fre 02/14/2023, 02/19/2024   Influenza,inj,Quad PF,6+ Mos 11/28/2012, 02/06/2014, 11/24/2016, 12/06/2019, 01/14/2022   Moderna Sars-Covid-2 Vaccination 09/21/2019, 10/20/2019, 03/18/2020   PNEUMOCOCCAL CONJUGATE-20 02/19/2024   Tdap 11/28/2012, 02/14/2023   Zoster Recombinant(Shingrix ) 01/14/2022, 02/14/2023    Health Maintenance  Topic Date Due   Mammogram  02/17/2023   COVID-19 Vaccine (4 - 2025-26 season) 11/04/2024 (Originally 11/06/2023)   Hepatitis B Vaccines 19-59 Average Risk (2 of 2 - CpG 2-dose series) 03/18/2024   Fecal DNA (Cologuard)  04/12/2026   Cervical Cancer Screening (HPV/Pap Cotest)  02/14/2028   DTaP/Tdap/Td (3 - Td or Tdap) 02/13/2033   Pneumococcal Vaccine: 50+ Years  Completed   Influenza Vaccine  Completed   Hepatitis C Screening  Completed   HIV Screening  Completed   Zoster Vaccines- Shingrix   Completed   HPV VACCINES  Aged Out   Meningococcal B Vaccine  Aged Out    Discussed health benefits of physical activity, and encouraged her to engage in regular exercise appropriate for her age and condition.   Annual physical exam  Prediabetes -     Hemoglobin  A1c  Morbid obesity (HCC)  Primary  hypertension Assessment & Plan: Chronic, controlled with lifestyle modifications.  Orders: -     Comprehensive metabolic panel with GFR -     Microalbumin / creatinine urine ratio  Chronic kidney disease, stage 2, mildly decreased GFR -     Comprehensive metabolic panel with GFR -     Microalbumin / creatinine urine ratio  Vitamin D  deficiency -     VITAMIN D  25 Hydroxy (Vit-D Deficiency, Fractures)  Elevated LDL cholesterol level -     Lipid panel  Encounter for screening mammogram for breast cancer -     3D Screening Mammogram, Left and Right; Future  Need for influenza vaccination -     Flu vaccine trivalent PF, 6mos and older(Flulaval,Afluria,Fluarix,Fluzone)  Pneumococcal vaccination administered at current visit -     Pneumococcal conjugate vaccine 20-valent  Hepatitis B vaccination administered at current visit -     Heplisav-B  (HepB-CPG) Vaccine      Annual physical exam Physical exam overall unremarkable except as noted above. Routine lab work ordered as noted.  Routine wellness visit with no acute concerns. Discussed health maintenance, vaccinations, and lifestyle. - Administered flu shot. - Administered pneumonia vaccine. - Administered hepatitis B vaccine series. - Discussed COVID vaccine; she declined. - Encouraged continued healthy eating and exercise.  Prediabetes Well-managed. Discussed diet and exercise for weight and blood sugar control. - Ordered A1c test. - Encouraged healthy diet and regular exercise.  Morbid obesity Focus on weight management through diet and exercise. Discussed weight loss medications; she prefers independent management. - Discussed potential referral to dietitian or weight loss clinic if needed. - Encouraged continued exercise and healthy eating.  Chronic kidney disease, stage 2, mildly decreased GFR Stage 2 CKD with no symptoms. Discussed monitoring kidney function and avoiding nephrotoxic medications. - Advised avoiding  excessive use of NSAIDs.  Vitamin D  deficiency Inconsistent vitamin D  supplementation. Plan to assess current levels. - Ordered vitamin D  level test.  Elevated LDL cholesterol level Plan to assess current cholesterol levels. - Ordered cholesterol panel.    Return in about 1 year (around 02/18/2025) for CPE w/Dr. Sharma.     I discussed the assessment and treatment plan with the patient  The patient was provided an opportunity to ask questions and all were answered. The patient agreed with the plan and demonstrated an understanding of the instructions.   The patient was advised to call back or seek an in-person evaluation if the symptoms worsen or if the condition fails to improve as anticipated.    LAURAINE LOISE BUOY, DO  [facility] [phone] (phone) [phone] (fax)  [facility]     [1] No Known Allergies [2]  Outpatient Medications Prior to Visit  Medication Sig   [DISCONTINUED] Vitamin D , Ergocalciferol , (DRISDOL ) 1.25 MG (50000 UNIT) CAPS capsule Take 1 capsule (50,000 Units total) by mouth every 7 (seven) days.   [DISCONTINUED] predniSONE  (DELTASONE ) 10 MG tablet Days 1-4 take 4 tablets (40 mg) daily  Days 5-8 take 3 tablets (30 mg) daily, Days 9-11 take 2 tablets (20 mg) daily, Days 12-14 take 1 tablet (10 mg) daily.   No facility-administered medications prior to visit.   "

## 2024-02-19 NOTE — Patient Instructions (Signed)
Increase your intake of fresh/frozen fruits and vegetables.  The Mediterranean diet is a great reference for meal ideas.  I strongly encourage you to incorporate exercise into your daily routine (at least 30 minutes most days of the week) and monitor your dietary intake. - I encourage you to measure/weigh your foods/beverages for a few days to get a more accurate idea of what different amounts of things look like on your plate or in your glass.  - Using smaller plates/glasses also helps in that it tricks our minds into thinking we've eaten more than we have. Studies have shown that we eat more when presented with larger plates, even with the same amount of food on them.   - Plan to eat until you are no longer hungry, rather than until you're full.  If you don't normally exercise, start with something simple or more enjoyable. You can plan to walk for your half hour or do something like dancing if you enjoy it.  Build up your activity over time; this will make it more enjoyable and reduce your risk of injury.  Here are some videos which offer a variety of different workout classes with different levels: https://[email or web address].com/session/203  It is completely free. If a full video is too much for you to do, you can do them in bite-sized chunks (just pausing when needed and resuming later in the day).   Even if these actions don't ultimately lead to weight loss, increasing your activity will still help to reduce your insulin resistance and will improve your cardiovascular health (making heart attack/stroke less likely as you age).

## 2024-02-19 NOTE — Assessment & Plan Note (Signed)
--   Chronic, controlled with lifestyle modifications

## 2024-02-20 LAB — COMPREHENSIVE METABOLIC PANEL WITH GFR
ALT: 17 IU/L (ref 0–32)
AST: 16 IU/L (ref 0–40)
Albumin: 4.4 g/dL (ref 3.8–4.9)
Alkaline Phosphatase: 75 IU/L (ref 49–135)
BUN/Creatinine Ratio: 19 (ref 9–23)
BUN: 14 mg/dL (ref 6–24)
Bilirubin Total: 0.4 mg/dL (ref 0.0–1.2)
CO2: 21 mmol/L (ref 20–29)
Calcium: 9.1 mg/dL (ref 8.7–10.2)
Chloride: 104 mmol/L (ref 96–106)
Creatinine, Ser: 0.75 mg/dL (ref 0.57–1.00)
Globulin, Total: 2.3 g/dL (ref 1.5–4.5)
Glucose: 85 mg/dL (ref 70–99)
Potassium: 4.4 mmol/L (ref 3.5–5.2)
Sodium: 144 mmol/L (ref 134–144)
Total Protein: 6.7 g/dL (ref 6.0–8.5)
eGFR: 95 mL/min/1.73 (ref >59)

## 2024-02-20 LAB — LIPID PANEL
Chol/HDL Ratio: 2.6 ratio (ref 0.0–4.4)
Cholesterol, Total: 171 mg/dL (ref 100–199)
HDL: 65 mg/dL (ref >39)
LDL Chol Calc (NIH): 86 mg/dL (ref 0–99)
Triglycerides: 114 mg/dL (ref 0–149)
VLDL Cholesterol Cal: 20 mg/dL (ref 5–40)

## 2024-02-20 LAB — MICROALBUMIN / CREATININE URINE RATIO
Creatinine, Urine: 54 mg/dL
Microalb/Creat Ratio: 49 mg/g{creat} — ABNORMAL HIGH (ref 0–29)
Microalbumin, Urine: 26.3 ug/mL

## 2024-02-20 LAB — HEMOGLOBIN A1C
Est. average glucose Bld gHb Est-mCnc: 114 mg/dL
Hgb A1c MFr Bld: 5.6 % (ref 4.8–5.6)

## 2024-02-20 LAB — VITAMIN D 25 HYDROXY (VIT D DEFICIENCY, FRACTURES): Vit D, 25-Hydroxy: 28.7 ng/mL — ABNORMAL LOW (ref 30.0–100.0)

## 2024-02-21 ENCOUNTER — Encounter: Payer: Self-pay | Admitting: Family Medicine

## 2024-02-26 ENCOUNTER — Ambulatory Visit: Payer: Self-pay | Admitting: Family Medicine
